# Patient Record
Sex: Female | Born: 1960 | Race: White | Hispanic: No | Marital: Married | Smoking: Never smoker
Health system: Southern US, Community
[De-identification: ages and names within clinical notes are randomized; demographics above are authoritative.]

## PROBLEM LIST (undated history)

## (undated) DIAGNOSIS — F32A Depression, unspecified: Secondary | ICD-10-CM

## (undated) DIAGNOSIS — F319 Bipolar disorder, unspecified: Secondary | ICD-10-CM

## (undated) DIAGNOSIS — S42009A Fracture of unspecified part of unspecified clavicle, initial encounter for closed fracture: Secondary | ICD-10-CM

## (undated) DIAGNOSIS — F329 Major depressive disorder, single episode, unspecified: Secondary | ICD-10-CM

## (undated) DIAGNOSIS — F209 Schizophrenia, unspecified: Secondary | ICD-10-CM

## (undated) HISTORY — PX: CLAVICLE SURGERY: SHX598

## (undated) HISTORY — PX: APPENDECTOMY: SHX54

## (undated) HISTORY — PX: BREAST ENHANCEMENT SURGERY: SHX7

## (undated) HISTORY — PX: CHOLECYSTECTOMY: SHX55

## (undated) HISTORY — PX: BREAST SURGERY: SHX581

---

## 2010-01-12 ENCOUNTER — Ambulatory Visit: Payer: Self-pay | Admitting: Internal Medicine

## 2010-09-27 ENCOUNTER — Ambulatory Visit: Payer: Self-pay | Admitting: Internal Medicine

## 2011-01-31 ENCOUNTER — Emergency Department: Payer: Self-pay | Admitting: Emergency Medicine

## 2011-07-23 ENCOUNTER — Emergency Department: Payer: Self-pay | Admitting: Emergency Medicine

## 2014-09-04 DIAGNOSIS — F333 Major depressive disorder, recurrent, severe with psychotic symptoms: Secondary | ICD-10-CM | POA: Diagnosis not present

## 2014-09-18 DIAGNOSIS — F333 Major depressive disorder, recurrent, severe with psychotic symptoms: Secondary | ICD-10-CM | POA: Diagnosis not present

## 2014-11-15 DIAGNOSIS — M5126 Other intervertebral disc displacement, lumbar region: Secondary | ICD-10-CM | POA: Diagnosis not present

## 2014-11-15 DIAGNOSIS — Y33XXXA Other specified events, undetermined intent, initial encounter: Secondary | ICD-10-CM | POA: Diagnosis not present

## 2014-11-15 DIAGNOSIS — S098XXA Other specified injuries of head, initial encounter: Secondary | ICD-10-CM | POA: Diagnosis not present

## 2014-11-15 DIAGNOSIS — M25512 Pain in left shoulder: Secondary | ICD-10-CM | POA: Diagnosis not present

## 2014-11-15 DIAGNOSIS — S299XXA Unspecified injury of thorax, initial encounter: Secondary | ICD-10-CM | POA: Diagnosis not present

## 2014-11-15 DIAGNOSIS — S2231XA Fracture of one rib, right side, initial encounter for closed fracture: Secondary | ICD-10-CM | POA: Diagnosis not present

## 2014-11-15 DIAGNOSIS — S01111A Laceration without foreign body of right eyelid and periocular area, initial encounter: Secondary | ICD-10-CM | POA: Diagnosis not present

## 2014-11-15 DIAGNOSIS — S0990XA Unspecified injury of head, initial encounter: Secondary | ICD-10-CM | POA: Diagnosis not present

## 2014-11-15 DIAGNOSIS — J939 Pneumothorax, unspecified: Secondary | ICD-10-CM | POA: Diagnosis not present

## 2014-11-15 DIAGNOSIS — M79602 Pain in left arm: Secondary | ICD-10-CM | POA: Diagnosis not present

## 2014-11-15 DIAGNOSIS — W19XXXA Unspecified fall, initial encounter: Secondary | ICD-10-CM | POA: Diagnosis not present

## 2014-11-15 DIAGNOSIS — F09 Unspecified mental disorder due to known physiological condition: Secondary | ICD-10-CM | POA: Diagnosis not present

## 2014-11-15 DIAGNOSIS — M5136 Other intervertebral disc degeneration, lumbar region: Secondary | ICD-10-CM | POA: Diagnosis not present

## 2014-11-15 DIAGNOSIS — S42022A Displaced fracture of shaft of left clavicle, initial encounter for closed fracture: Secondary | ICD-10-CM | POA: Diagnosis not present

## 2014-11-15 DIAGNOSIS — Z9882 Breast implant status: Secondary | ICD-10-CM | POA: Diagnosis not present

## 2014-11-15 DIAGNOSIS — S2242XA Multiple fractures of ribs, left side, initial encounter for closed fracture: Secondary | ICD-10-CM | POA: Diagnosis not present

## 2014-11-15 DIAGNOSIS — S42024A Nondisplaced fracture of shaft of right clavicle, initial encounter for closed fracture: Secondary | ICD-10-CM | POA: Diagnosis not present

## 2014-11-15 DIAGNOSIS — S2232XA Fracture of one rib, left side, initial encounter for closed fracture: Secondary | ICD-10-CM | POA: Diagnosis not present

## 2014-11-15 DIAGNOSIS — S270XXA Traumatic pneumothorax, initial encounter: Secondary | ICD-10-CM | POA: Diagnosis not present

## 2014-11-15 DIAGNOSIS — G8921 Chronic pain due to trauma: Secondary | ICD-10-CM | POA: Diagnosis not present

## 2014-11-15 DIAGNOSIS — G8911 Acute pain due to trauma: Secondary | ICD-10-CM | POA: Diagnosis not present

## 2014-11-15 DIAGNOSIS — M545 Low back pain: Secondary | ICD-10-CM | POA: Diagnosis not present

## 2014-11-15 DIAGNOSIS — S42002A Fracture of unspecified part of left clavicle, initial encounter for closed fracture: Secondary | ICD-10-CM | POA: Diagnosis not present

## 2014-11-15 DIAGNOSIS — Z043 Encounter for examination and observation following other accident: Secondary | ICD-10-CM | POA: Diagnosis not present

## 2014-11-26 DIAGNOSIS — S2232XA Fracture of one rib, left side, initial encounter for closed fracture: Secondary | ICD-10-CM | POA: Diagnosis not present

## 2014-11-26 DIAGNOSIS — S42022A Displaced fracture of shaft of left clavicle, initial encounter for closed fracture: Secondary | ICD-10-CM | POA: Diagnosis not present

## 2014-11-26 DIAGNOSIS — Y33XXXA Other specified events, undetermined intent, initial encounter: Secondary | ICD-10-CM | POA: Diagnosis not present

## 2014-11-26 DIAGNOSIS — M898X1 Other specified disorders of bone, shoulder: Secondary | ICD-10-CM | POA: Diagnosis not present

## 2014-11-26 DIAGNOSIS — S42022D Displaced fracture of shaft of left clavicle, subsequent encounter for fracture with routine healing: Secondary | ICD-10-CM | POA: Diagnosis not present

## 2014-11-26 DIAGNOSIS — S2249XA Multiple fractures of ribs, unspecified side, initial encounter for closed fracture: Secondary | ICD-10-CM | POA: Diagnosis not present

## 2014-11-28 DIAGNOSIS — S42022P Displaced fracture of shaft of left clavicle, subsequent encounter for fracture with malunion: Secondary | ICD-10-CM | POA: Diagnosis not present

## 2014-12-04 DIAGNOSIS — S42002D Fracture of unspecified part of left clavicle, subsequent encounter for fracture with routine healing: Secondary | ICD-10-CM | POA: Diagnosis not present

## 2014-12-11 DIAGNOSIS — S42002D Fracture of unspecified part of left clavicle, subsequent encounter for fracture with routine healing: Secondary | ICD-10-CM | POA: Diagnosis not present

## 2014-12-18 DIAGNOSIS — S42002A Fracture of unspecified part of left clavicle, initial encounter for closed fracture: Secondary | ICD-10-CM | POA: Diagnosis not present

## 2014-12-18 DIAGNOSIS — Z5321 Procedure and treatment not carried out due to patient leaving prior to being seen by health care provider: Secondary | ICD-10-CM | POA: Diagnosis not present

## 2014-12-18 DIAGNOSIS — S42002D Fracture of unspecified part of left clavicle, subsequent encounter for fracture with routine healing: Secondary | ICD-10-CM | POA: Diagnosis not present

## 2014-12-18 DIAGNOSIS — G8929 Other chronic pain: Secondary | ICD-10-CM | POA: Diagnosis not present

## 2014-12-18 DIAGNOSIS — M25512 Pain in left shoulder: Secondary | ICD-10-CM | POA: Diagnosis not present

## 2014-12-25 DIAGNOSIS — S42022D Displaced fracture of shaft of left clavicle, subsequent encounter for fracture with routine healing: Secondary | ICD-10-CM | POA: Diagnosis not present

## 2015-01-08 DIAGNOSIS — M25512 Pain in left shoulder: Secondary | ICD-10-CM | POA: Diagnosis not present

## 2015-01-08 DIAGNOSIS — S42002A Fracture of unspecified part of left clavicle, initial encounter for closed fracture: Secondary | ICD-10-CM | POA: Diagnosis not present

## 2015-01-08 DIAGNOSIS — G8929 Other chronic pain: Secondary | ICD-10-CM | POA: Diagnosis not present

## 2015-01-08 DIAGNOSIS — S42002D Fracture of unspecified part of left clavicle, subsequent encounter for fracture with routine healing: Secondary | ICD-10-CM | POA: Diagnosis not present

## 2015-01-08 DIAGNOSIS — Z5321 Procedure and treatment not carried out due to patient leaving prior to being seen by health care provider: Secondary | ICD-10-CM | POA: Diagnosis not present

## 2015-01-28 DIAGNOSIS — G8929 Other chronic pain: Secondary | ICD-10-CM | POA: Diagnosis not present

## 2015-01-28 DIAGNOSIS — S42002D Fracture of unspecified part of left clavicle, subsequent encounter for fracture with routine healing: Secondary | ICD-10-CM | POA: Diagnosis not present

## 2015-01-30 DIAGNOSIS — M25519 Pain in unspecified shoulder: Secondary | ICD-10-CM | POA: Diagnosis not present

## 2015-02-25 DIAGNOSIS — Z8781 Personal history of (healed) traumatic fracture: Secondary | ICD-10-CM | POA: Diagnosis not present

## 2015-02-25 DIAGNOSIS — T8484XA Pain due to internal orthopedic prosthetic devices, implants and grafts, initial encounter: Secondary | ICD-10-CM | POA: Diagnosis not present

## 2015-02-25 DIAGNOSIS — M25512 Pain in left shoulder: Secondary | ICD-10-CM | POA: Diagnosis not present

## 2015-02-27 DIAGNOSIS — G8929 Other chronic pain: Secondary | ICD-10-CM | POA: Diagnosis not present

## 2015-02-27 DIAGNOSIS — Z4789 Encounter for other orthopedic aftercare: Secondary | ICD-10-CM | POA: Diagnosis not present

## 2015-02-27 DIAGNOSIS — S42002A Fracture of unspecified part of left clavicle, initial encounter for closed fracture: Secondary | ICD-10-CM | POA: Diagnosis not present

## 2015-02-27 DIAGNOSIS — M25512 Pain in left shoulder: Secondary | ICD-10-CM | POA: Diagnosis not present

## 2015-02-27 DIAGNOSIS — S42002D Fracture of unspecified part of left clavicle, subsequent encounter for fracture with routine healing: Secondary | ICD-10-CM | POA: Diagnosis not present

## 2015-03-08 DIAGNOSIS — G8929 Other chronic pain: Secondary | ICD-10-CM | POA: Diagnosis not present

## 2015-03-08 DIAGNOSIS — S42002D Fracture of unspecified part of left clavicle, subsequent encounter for fracture with routine healing: Secondary | ICD-10-CM | POA: Diagnosis not present

## 2015-03-08 DIAGNOSIS — S42002A Fracture of unspecified part of left clavicle, initial encounter for closed fracture: Secondary | ICD-10-CM | POA: Diagnosis not present

## 2015-03-08 DIAGNOSIS — Z4789 Encounter for other orthopedic aftercare: Secondary | ICD-10-CM | POA: Diagnosis not present

## 2015-03-08 DIAGNOSIS — M25512 Pain in left shoulder: Secondary | ICD-10-CM | POA: Diagnosis not present

## 2015-03-12 DIAGNOSIS — Z4789 Encounter for other orthopedic aftercare: Secondary | ICD-10-CM | POA: Diagnosis not present

## 2015-03-12 DIAGNOSIS — G8929 Other chronic pain: Secondary | ICD-10-CM | POA: Diagnosis not present

## 2015-03-12 DIAGNOSIS — S42002D Fracture of unspecified part of left clavicle, subsequent encounter for fracture with routine healing: Secondary | ICD-10-CM | POA: Diagnosis not present

## 2015-03-12 DIAGNOSIS — M25512 Pain in left shoulder: Secondary | ICD-10-CM | POA: Diagnosis not present

## 2015-03-12 DIAGNOSIS — S42002A Fracture of unspecified part of left clavicle, initial encounter for closed fracture: Secondary | ICD-10-CM | POA: Diagnosis not present

## 2015-03-19 DIAGNOSIS — S42002A Fracture of unspecified part of left clavicle, initial encounter for closed fracture: Secondary | ICD-10-CM | POA: Diagnosis not present

## 2015-03-19 DIAGNOSIS — Z4789 Encounter for other orthopedic aftercare: Secondary | ICD-10-CM | POA: Diagnosis not present

## 2015-03-19 DIAGNOSIS — M25512 Pain in left shoulder: Secondary | ICD-10-CM | POA: Diagnosis not present

## 2015-03-19 DIAGNOSIS — G8929 Other chronic pain: Secondary | ICD-10-CM | POA: Diagnosis not present

## 2015-03-19 DIAGNOSIS — S42002D Fracture of unspecified part of left clavicle, subsequent encounter for fracture with routine healing: Secondary | ICD-10-CM | POA: Diagnosis not present

## 2015-03-26 DIAGNOSIS — M25512 Pain in left shoulder: Secondary | ICD-10-CM | POA: Diagnosis not present

## 2015-03-26 DIAGNOSIS — S42022D Displaced fracture of shaft of left clavicle, subsequent encounter for fracture with routine healing: Secondary | ICD-10-CM | POA: Diagnosis not present

## 2015-03-26 DIAGNOSIS — G8929 Other chronic pain: Secondary | ICD-10-CM | POA: Diagnosis not present

## 2015-03-26 DIAGNOSIS — S42002A Fracture of unspecified part of left clavicle, initial encounter for closed fracture: Secondary | ICD-10-CM | POA: Diagnosis not present

## 2015-03-26 DIAGNOSIS — Y33XXXD Other specified events, undetermined intent, subsequent encounter: Secondary | ICD-10-CM | POA: Diagnosis not present

## 2015-03-26 DIAGNOSIS — S42002D Fracture of unspecified part of left clavicle, subsequent encounter for fracture with routine healing: Secondary | ICD-10-CM | POA: Diagnosis not present

## 2015-03-26 DIAGNOSIS — Z4789 Encounter for other orthopedic aftercare: Secondary | ICD-10-CM | POA: Diagnosis not present

## 2015-04-30 DIAGNOSIS — F2089 Other schizophrenia: Secondary | ICD-10-CM | POA: Diagnosis not present

## 2015-04-30 DIAGNOSIS — F39 Unspecified mood [affective] disorder: Secondary | ICD-10-CM | POA: Diagnosis not present

## 2015-04-30 DIAGNOSIS — F333 Major depressive disorder, recurrent, severe with psychotic symptoms: Secondary | ICD-10-CM | POA: Diagnosis not present

## 2015-05-07 DIAGNOSIS — S42002D Fracture of unspecified part of left clavicle, subsequent encounter for fracture with routine healing: Secondary | ICD-10-CM | POA: Diagnosis not present

## 2015-05-07 DIAGNOSIS — M25512 Pain in left shoulder: Secondary | ICD-10-CM | POA: Diagnosis not present

## 2015-05-07 DIAGNOSIS — Y33XXXD Other specified events, undetermined intent, subsequent encounter: Secondary | ICD-10-CM | POA: Diagnosis not present

## 2015-05-27 DIAGNOSIS — N61 Mastitis without abscess: Secondary | ICD-10-CM | POA: Diagnosis not present

## 2015-05-27 DIAGNOSIS — Z1231 Encounter for screening mammogram for malignant neoplasm of breast: Secondary | ICD-10-CM | POA: Diagnosis not present

## 2015-05-28 ENCOUNTER — Other Ambulatory Visit: Payer: Self-pay | Admitting: Family Medicine

## 2015-05-28 DIAGNOSIS — Z1231 Encounter for screening mammogram for malignant neoplasm of breast: Secondary | ICD-10-CM

## 2015-06-04 ENCOUNTER — Ambulatory Visit: Payer: Self-pay

## 2015-06-18 DIAGNOSIS — R922 Inconclusive mammogram: Secondary | ICD-10-CM | POA: Diagnosis not present

## 2015-06-18 DIAGNOSIS — Z9882 Breast implant status: Secondary | ICD-10-CM | POA: Diagnosis not present

## 2015-06-18 DIAGNOSIS — N61 Mastitis without abscess: Secondary | ICD-10-CM | POA: Diagnosis not present

## 2015-06-18 DIAGNOSIS — N63 Unspecified lump in breast: Secondary | ICD-10-CM | POA: Diagnosis not present

## 2015-06-25 DIAGNOSIS — Y33XXXD Other specified events, undetermined intent, subsequent encounter: Secondary | ICD-10-CM | POA: Diagnosis not present

## 2015-06-25 DIAGNOSIS — S42002D Fracture of unspecified part of left clavicle, subsequent encounter for fracture with routine healing: Secondary | ICD-10-CM | POA: Diagnosis not present

## 2015-06-25 DIAGNOSIS — Z8781 Personal history of (healed) traumatic fracture: Secondary | ICD-10-CM | POA: Diagnosis not present

## 2015-06-25 DIAGNOSIS — Z967 Presence of other bone and tendon implants: Secondary | ICD-10-CM | POA: Diagnosis not present

## 2015-06-28 DIAGNOSIS — N63 Unspecified lump in breast: Secondary | ICD-10-CM | POA: Diagnosis not present

## 2015-07-08 DIAGNOSIS — R599 Enlarged lymph nodes, unspecified: Secondary | ICD-10-CM | POA: Diagnosis not present

## 2015-07-08 DIAGNOSIS — N63 Unspecified lump in breast: Secondary | ICD-10-CM | POA: Diagnosis not present

## 2015-07-08 DIAGNOSIS — N644 Mastodynia: Secondary | ICD-10-CM | POA: Diagnosis not present

## 2015-07-08 DIAGNOSIS — I10 Essential (primary) hypertension: Secondary | ICD-10-CM | POA: Diagnosis not present

## 2015-07-08 DIAGNOSIS — N6009 Solitary cyst of unspecified breast: Secondary | ICD-10-CM | POA: Diagnosis not present

## 2015-07-08 DIAGNOSIS — R928 Other abnormal and inconclusive findings on diagnostic imaging of breast: Secondary | ICD-10-CM | POA: Diagnosis not present

## 2015-07-08 DIAGNOSIS — N6001 Solitary cyst of right breast: Secondary | ICD-10-CM | POA: Diagnosis not present

## 2015-07-23 DIAGNOSIS — S42002K Fracture of unspecified part of left clavicle, subsequent encounter for fracture with nonunion: Secondary | ICD-10-CM | POA: Diagnosis not present

## 2015-07-23 DIAGNOSIS — F329 Major depressive disorder, single episode, unspecified: Secondary | ICD-10-CM | POA: Diagnosis not present

## 2015-07-23 DIAGNOSIS — Z9102 Food additives allergy status: Secondary | ICD-10-CM | POA: Diagnosis not present

## 2015-07-23 DIAGNOSIS — Z885 Allergy status to narcotic agent status: Secondary | ICD-10-CM | POA: Diagnosis not present

## 2015-07-23 DIAGNOSIS — S42024A Nondisplaced fracture of shaft of right clavicle, initial encounter for closed fracture: Secondary | ICD-10-CM | POA: Diagnosis not present

## 2015-07-23 DIAGNOSIS — Z9049 Acquired absence of other specified parts of digestive tract: Secondary | ICD-10-CM | POA: Diagnosis not present

## 2015-08-06 DIAGNOSIS — Z4789 Encounter for other orthopedic aftercare: Secondary | ICD-10-CM | POA: Diagnosis not present

## 2015-08-06 DIAGNOSIS — S42002K Fracture of unspecified part of left clavicle, subsequent encounter for fracture with nonunion: Secondary | ICD-10-CM | POA: Diagnosis not present

## 2015-08-16 DIAGNOSIS — S42002K Fracture of unspecified part of left clavicle, subsequent encounter for fracture with nonunion: Secondary | ICD-10-CM | POA: Diagnosis not present

## 2015-08-16 DIAGNOSIS — Z4789 Encounter for other orthopedic aftercare: Secondary | ICD-10-CM | POA: Diagnosis not present

## 2015-08-20 DIAGNOSIS — S42022D Displaced fracture of shaft of left clavicle, subsequent encounter for fracture with routine healing: Secondary | ICD-10-CM | POA: Diagnosis not present

## 2015-08-20 DIAGNOSIS — M25512 Pain in left shoulder: Secondary | ICD-10-CM | POA: Diagnosis not present

## 2015-09-17 DIAGNOSIS — S42002A Fracture of unspecified part of left clavicle, initial encounter for closed fracture: Secondary | ICD-10-CM | POA: Diagnosis not present

## 2015-09-17 DIAGNOSIS — Y33XXXA Other specified events, undetermined intent, initial encounter: Secondary | ICD-10-CM | POA: Diagnosis not present

## 2015-10-15 DIAGNOSIS — S42002D Fracture of unspecified part of left clavicle, subsequent encounter for fracture with routine healing: Secondary | ICD-10-CM | POA: Diagnosis not present

## 2015-10-15 DIAGNOSIS — Y33XXXD Other specified events, undetermined intent, subsequent encounter: Secondary | ICD-10-CM | POA: Diagnosis not present

## 2015-11-12 DIAGNOSIS — F333 Major depressive disorder, recurrent, severe with psychotic symptoms: Secondary | ICD-10-CM | POA: Diagnosis not present

## 2016-01-17 DIAGNOSIS — S0990XA Unspecified injury of head, initial encounter: Secondary | ICD-10-CM | POA: Diagnosis not present

## 2016-01-17 DIAGNOSIS — R0989 Other specified symptoms and signs involving the circulatory and respiratory systems: Secondary | ICD-10-CM | POA: Diagnosis not present

## 2016-01-17 DIAGNOSIS — R112 Nausea with vomiting, unspecified: Secondary | ICD-10-CM | POA: Diagnosis not present

## 2016-01-17 DIAGNOSIS — R0682 Tachypnea, not elsewhere classified: Secondary | ICD-10-CM | POA: Diagnosis not present

## 2016-01-17 DIAGNOSIS — J9690 Respiratory failure, unspecified, unspecified whether with hypoxia or hypercapnia: Secondary | ICD-10-CM | POA: Diagnosis not present

## 2016-01-17 DIAGNOSIS — R062 Wheezing: Secondary | ICD-10-CM | POA: Diagnosis not present

## 2016-01-17 DIAGNOSIS — S0031XA Abrasion of nose, initial encounter: Secondary | ICD-10-CM | POA: Diagnosis not present

## 2016-01-17 DIAGNOSIS — R079 Chest pain, unspecified: Secondary | ICD-10-CM | POA: Diagnosis not present

## 2016-01-17 DIAGNOSIS — S199XXA Unspecified injury of neck, initial encounter: Secondary | ICD-10-CM | POA: Diagnosis not present

## 2016-01-17 DIAGNOSIS — E872 Acidosis: Secondary | ICD-10-CM | POA: Diagnosis not present

## 2016-01-17 DIAGNOSIS — R51 Headache: Secondary | ICD-10-CM | POA: Diagnosis not present

## 2016-01-18 DIAGNOSIS — J690 Pneumonitis due to inhalation of food and vomit: Secondary | ICD-10-CM | POA: Diagnosis present

## 2016-01-18 DIAGNOSIS — E872 Acidosis: Secondary | ICD-10-CM | POA: Diagnosis present

## 2016-01-18 DIAGNOSIS — F329 Major depressive disorder, single episode, unspecified: Secondary | ICD-10-CM | POA: Diagnosis present

## 2016-01-18 DIAGNOSIS — R51 Headache: Secondary | ICD-10-CM | POA: Diagnosis present

## 2016-01-18 DIAGNOSIS — D72829 Elevated white blood cell count, unspecified: Secondary | ICD-10-CM | POA: Diagnosis not present

## 2016-01-18 DIAGNOSIS — J9601 Acute respiratory failure with hypoxia: Secondary | ICD-10-CM | POA: Diagnosis present

## 2016-01-18 DIAGNOSIS — R918 Other nonspecific abnormal finding of lung field: Secondary | ICD-10-CM | POA: Diagnosis not present

## 2016-01-18 DIAGNOSIS — J810 Acute pulmonary edema: Secondary | ICD-10-CM | POA: Diagnosis not present

## 2016-01-18 DIAGNOSIS — R000 Tachycardia, unspecified: Secondary | ICD-10-CM | POA: Diagnosis not present

## 2016-01-18 DIAGNOSIS — H538 Other visual disturbances: Secondary | ICD-10-CM | POA: Diagnosis present

## 2016-01-18 DIAGNOSIS — R042 Hemoptysis: Secondary | ICD-10-CM | POA: Diagnosis not present

## 2016-01-18 DIAGNOSIS — T751XXA Unspecified effects of drowning and nonfatal submersion, initial encounter: Secondary | ICD-10-CM | POA: Diagnosis present

## 2016-01-18 DIAGNOSIS — Z885 Allergy status to narcotic agent status: Secondary | ICD-10-CM | POA: Diagnosis not present

## 2016-01-24 DIAGNOSIS — T751XXS Unspecified effects of drowning and nonfatal submersion, sequela: Secondary | ICD-10-CM | POA: Diagnosis not present

## 2016-01-24 DIAGNOSIS — F3342 Major depressive disorder, recurrent, in full remission: Secondary | ICD-10-CM | POA: Diagnosis not present

## 2016-01-24 DIAGNOSIS — N951 Menopausal and female climacteric states: Secondary | ICD-10-CM | POA: Diagnosis not present

## 2016-03-23 ENCOUNTER — Ambulatory Visit
Admission: EM | Admit: 2016-03-23 | Discharge: 2016-03-23 | Disposition: A | Discharge status: Home / Self Care | Payer: Medicare Other | Attending: Family Medicine | Admitting: Family Medicine

## 2016-03-23 ENCOUNTER — Encounter: Payer: Self-pay | Admitting: *Deleted

## 2016-03-23 DIAGNOSIS — T63481A Toxic effect of venom of other arthropod, accidental (unintentional), initial encounter: Secondary | ICD-10-CM | POA: Diagnosis not present

## 2016-03-23 DIAGNOSIS — L089 Local infection of the skin and subcutaneous tissue, unspecified: Secondary | ICD-10-CM

## 2016-03-23 HISTORY — DX: Major depressive disorder, single episode, unspecified: F32.9

## 2016-03-23 HISTORY — DX: Depression, unspecified: F32.A

## 2016-03-23 MED ORDER — PREDNISONE 20 MG PO TABS: 40.0000 mg | ORAL_TABLET | Freq: Every day | ORAL | 0 refills | Status: DC

## 2016-03-23 MED ORDER — MUPIROCIN 2 % EX OINT: TOPICAL_OINTMENT | CUTANEOUS | 0 refills | Status: DC

## 2016-03-23 MED ORDER — SULFAMETHOXAZOLE-TRIMETHOPRIM 800-160 MG PO TABS: 1.0000 | ORAL_TABLET | Freq: Two times a day (BID) | ORAL | 0 refills | Status: AC

## 2016-03-23 NOTE — ED Provider Notes (Signed)
[facility] ____________________________________________  Time seen: Approximately 11:22 AM  I have reviewed the triage vital signs and the nursing notes.   HISTORY  Chief Complaint Insect Bite   HPI Frances Stanley is a 55 y.o. female presents for complaints of redness to bilateral forearms. Patient reports 3 days ago she was working outside and was stung by wasps multiples times to her upper arms and one to face. States stung twice to right arm and three times to left. States she immediately had some swelling in these areas, but reports has since had redness onset and continued swelling. States concern for infection.   Reports the areas are itchy, but minimally tender. Denies current pain. Reports feels well otherwise. Reports unresolved with over the counter benadryl.   Denies chest pain, shortness of breath, facial or lip or oral swelling. Denies numbness, tingling sensation. Denies decreased range of motion. Reports continues to eat and drink well. Denies recent sickness. Denies other insect bite or stings, denies tick bite or tick attachments.  No LMP recorded. Patient is postmenopausal.  Reports tetanus immunization is up to date.    Past Medical History:  Diagnosis Date  . Depression     There are no active problems to display for this patient.   History reviewed. No pertinent surgical history.   No current facility-administered medications for this encounter.   Current Outpatient Prescriptions:  .  venlafaxine (EFFEXOR) 75 MG tablet, Take 75 mg by mouth 2 (two) times daily., Disp: , Rfl:  .  mupirocin ointment (BACTROBAN) 2 %, Apply three times a day for 5 days., Disp: 22 g, Rfl: 0 .  predniSONE (DELTASONE) 20 MG tablet, Take 2 tablets (40 mg total) by mouth daily., Disp: 10 tablet, Rfl: 0 .  sulfamethoxazole-trimethoprim (BACTRIM DS,SEPTRA DS) 800-160 MG tablet, Take 1 tablet by mouth 2 (two) times daily., Disp: 20 tablet, Rfl: 0  Allergies Morphine  and related  History reviewed. No pertinent family history.  Social History Social History  Substance Use Topics  . Smoking status: Never Smoker  . Smokeless tobacco: Never Used  . Alcohol use Yes    Review of Systems Constitutional: No fever/chills Eyes: No visual changes. ENT: No sore throat. Cardiovascular: Denies chest pain. Respiratory: Denies shortness of breath. Gastrointestinal: No abdominal pain.  No nausea, no vomiting.  No diarrhea.  No constipation. Genitourinary: Negative for dysuria. Musculoskeletal: Negative for back pain. Skin: positive for rash. Neurological: Negative for headaches, focal weakness or numbness.  10-point ROS otherwise negative.  ____________________________________________   PHYSICAL EXAM:  VITAL SIGNS: ED Triage Vitals [03/23/16 1053]  Enc Vitals Group     BP (!) 152/93     Pulse Rate 81     Resp 16     Temp 97.9 F (36.6 C)     Temp Source Oral     SpO2 100 %     Weight 184 lb (83.5 kg)     Height  (1.727 m)     Head Circumference      Peak Flow      Pain Score      Pain Loc      Pain Edu?      Excl. in GC?     Constitutional: Alert and oriented. Well appearing and in no acute distress. Eyes: Conjunctivae are normal. PERRL. EOMI. ENT      Head: Normocephalic and atraumatic.      Nose: No congestion/rhinnorhea.      Mouth/Throat: Mucous membranes are moist.Oropharynx non-erythematous.  No tonsillar swelling. No lip, tongue or oral swelling.  Hematological/Lymphatic/Immunilogical: No cervical lymphadenopathy. Cardiovascular: Normal rate, regular rhythm. Grossly normal heart sounds.  Good peripheral circulation. Respiratory: Normal respiratory effort without tachypnea nor retractions. Breath sounds are clear and equal bilaterally. No wheezes/rales/rhonchi.. Gastrointestinal: Soft and nontender. No distention.  Musculoskeletal:  Nontender with normal range of motion in all extremities. No midline cervical, thoracic or  lumbar tenderness to palpation.       Right lower leg:  No tenderness or edema.      Left lower leg:  No tenderness or edema.  Neurologic:  Normal speech and language. No gross focal neurologic deficits are appreciated. Speech is normal. No gait instability.  Skin:  Skin is warm, dry and intact. No rash noted. Except: left upper medial arm x two areas of approximately 10cm x7 cm of erythema and minimal swelling with centered punctum, and right upper arm small area of mild erythema with centered punctum, swelling and erythema are non-circumferential, no induration, no fluctuance, nontender, no drainage, no foreign body visualized, full range of motion to bilateral upper and lower extremities with normal sensation, no motor or tendon deficits to bilateral upper extremities, bilateral hand grips strong and equal, bilateral distal radial pulses equal and easily palpated.  Psychiatric: Mood and affect are normal. Speech and behavior are normal. Patient exhibits appropriate insight and judgment   ___________________________________________   LABS (all labs ordered are listed, but only abnormal results are displayed)  Labs Reviewed - No data to display  PROCEDURES Procedures   INITIAL IMPRESSION / ASSESSMENT AND PLAN / ED COURSE  Pertinent labs & imaging results that were available during my care of the patient were reviewed by me and considered in my medical decision making (see chart for details).  Very well-appearing patient. No acute distress. Presents for the complaints of redness with mild swelling directly surrounding recent wasp stings. Patient reports she had some immediate swelling but states concerned as the redness has gradually came on and has continued to spread. Neurovascular intact. Patient denies pain or other complaints. Suspect local reaction to insect sting and concern for secondary cellulitis. Will treat patient with oral prednisone and oral Bactrim, will also treat with topical  Bactroban. Encourage closely monitoring and avoidance of scratching. Encourage PCP follow up as needed.  Discussed follow up with Primary care physician this week. Discussed follow up and return parameters including no resolution or any worsening concerns. Patient verbalized understanding and agreed to plan.   ____________________________________________   FINAL CLINICAL IMPRESSION(S) / ED DIAGNOSES  Final diagnoses:  Local reaction to insect sting, accidental or unintentional, initial encounter  Skin infection     Discharge Medication List as of 03/23/2016 11:26 AM    START taking these medications   Details  mupirocin ointment (BACTROBAN) 2 % Apply three times a day for 5 days., Normal    predniSONE (DELTASONE) 20 MG tablet Take 2 tablets (40 mg total) by mouth daily., Starting Mon 03/23/2016, Normal    sulfamethoxazole-trimethoprim (BACTRIM DS,SEPTRA DS) 800-160 MG tablet Take 1 tablet by mouth 2 (two) times daily., Starting Mon 03/23/2016, Until Mon 03/30/2016, Normal        Note: This dictation was prepared with Dragon dictation along with smaller phrase technology. Any transcriptional errors that result from this process are unintentional.    Clinical Course      Renford DillsLindsey Brazil Voytko, NP 03/27/16 1456

## 2016-03-23 NOTE — ED Triage Notes (Signed)
Pt was stung several times by wasps to both arms and face. Occurred Friday.  Arms red and edematous.

## 2016-03-23 NOTE — Discharge Instructions (Signed)
Take medication as prescribed. Rest. Drink plenty of fluids. Elevate, avoid scratching.   Follow up with your primary care physician this week as needed. Return to Urgent care for new or worsening concerns.

## 2016-05-12 DIAGNOSIS — F333 Major depressive disorder, recurrent, severe with psychotic symptoms: Secondary | ICD-10-CM | POA: Diagnosis not present

## 2016-06-02 DIAGNOSIS — F333 Major depressive disorder, recurrent, severe with psychotic symptoms: Secondary | ICD-10-CM | POA: Diagnosis not present

## 2016-06-26 DIAGNOSIS — H43811 Vitreous degeneration, right eye: Secondary | ICD-10-CM | POA: Diagnosis not present

## 2016-06-26 DIAGNOSIS — H43392 Other vitreous opacities, left eye: Secondary | ICD-10-CM | POA: Diagnosis not present

## 2016-07-20 DIAGNOSIS — R399 Unspecified symptoms and signs involving the genitourinary system: Secondary | ICD-10-CM | POA: Diagnosis not present

## 2016-08-02 ENCOUNTER — Encounter: Payer: Self-pay | Admitting: Gynecology

## 2016-08-02 ENCOUNTER — Ambulatory Visit
Admission: EM | Admit: 2016-08-02 | Discharge: 2016-08-02 | Disposition: A | Discharge status: Home / Self Care | Payer: Medicare Other | Attending: Family Medicine | Admitting: Family Medicine

## 2016-08-02 DIAGNOSIS — B029 Zoster without complications: Secondary | ICD-10-CM | POA: Diagnosis not present

## 2016-08-02 HISTORY — DX: Fracture of unspecified part of unspecified clavicle, initial encounter for closed fracture: S42.009A

## 2016-08-02 MED ORDER — VALACYCLOVIR HCL 1 G PO TABS: 1000.0000 mg | ORAL_TABLET | Freq: Three times a day (TID) | ORAL | 0 refills | Status: DC

## 2016-08-02 MED ORDER — GABAPENTIN 300 MG PO CAPS: ORAL_CAPSULE | ORAL | 0 refills | Status: DC

## 2016-08-02 NOTE — ED Triage Notes (Signed)
Per patient burning / painful rash at back and underneath her breast xyesterday

## 2016-08-02 NOTE — Discharge Instructions (Signed)
Take the medications as prescribed.  You can continue to use tylenol and motrin as well.  Take care  Dr. Adriana Simasook

## 2016-08-02 NOTE — ED Provider Notes (Signed)
[facility]  CSN: [id number] Arrival date & time: 08/02/16  [id number]   History   Chief Complaint Chief Complaint  Patient presents with  . Herpes Zoster   HPI  55 year old female presents with concerns for shingles.  Patient states that she developed a sensation of pins and needles and subsequently developed a rash on her upper back extending anteriorly to the right breast. She reports that it has been extremely painful. She's had associated body aches and generalized malaise. No fevers or chills. She been taking Motrin and Tylenol with mild relief. No other associated symptoms. No other complaints or concerns at this time.  Past Medical History:  Diagnosis Date  . Clavicle fracture   . Depression    Past Surgical History:  Procedure Laterality Date  . CLAVICLE SURGERY     OB History    No data available     Home Medications    Prior to Admission medications   Medication Sig Start Date End Date Taking? Authorizing Provider  venlafaxine (EFFEXOR) 75 MG tablet Take 75 mg by mouth 2 (two) times daily.   Yes Historical Provider, MD  gabapentin (NEURONTIN) 300 MG capsule 300 mg on day 1, 300 mg twice daily on day 2, then 300 mg three times daily for postherpetic neuralgia. Can be increased to 600 mg three times daily if needed. 08/02/16   Tommie SamsJayce G Browning [place], DO  mupirocin ointment (BACTROBAN) 2 % Apply three times a day for 5 days. 03/23/16   Renford DillsLindsey Miller, NP  predniSONE (DELTASONE) 20 MG tablet Take 2 tablets (40 mg total) by mouth daily. 03/23/16   Renford DillsLindsey Miller, NP  valACYclovir (VALTREX) 1000 MG tablet Take 1 tablet (1,000 mg total) by mouth 3 (three) times daily. 08/02/16   Tommie SamsJayce G Mackenzey Crownover, DO   Family History No pertinent family history for this issue.  Social History Social History  Substance Use Topics  . Smoking status: Never Smoker  . Smokeless tobacco: Never Used  . Alcohol use Yes   Allergies   Morphine and related  Review of Systems Review of Systems    Constitutional: Positive for fatigue.  Gastrointestinal: Positive for nausea.  Skin: Positive for rash.   Physical Exam Triage Vital Signs ED Triage Vitals  Enc Vitals Group     BP 08/02/16 0919 (!) 147/77     Pulse Rate 08/02/16 0919 72     Resp --      Temp 08/02/16 0919 98.1 F (36.7 C)     Temp Source 08/02/16 0919 Oral     SpO2 08/02/16 0919 99 %     Weight 08/02/16 0921 187 lb (84.8 kg)     Height --      Head Circumference --      Peak Flow --      Pain Score --      Pain Loc --      Pain Edu? --      Excl. in GC? --    Updated Vital Signs BP (!) 147/77 (BP Location: Left Arm)   Pulse 72   Temp 98.1 F (36.7 C) (Oral)   Wt 187 lb (84.8 kg)   SpO2 99%   BMI 28.43 kg/m   Physical Exam  Constitutional: She is oriented to person, place, and time. She appears well-developed. No distress.  HENT:  Head: Normocephalic and atraumatic.  Pulmonary/Chest: Effort normal.  Neurological: She is alert and oriented to person, place, and time.  Skin:  Scattered vesicular rash noted on  the right upper back.   Psychiatric: She has a normal mood and affect.  Vitals reviewed.  UC Treatments / Results  Labs (all labs ordered are listed, but only abnormal results are displayed) Labs Reviewed - No data to display  EKG  EKG Interpretation None       Radiology No results found.  Procedures Procedures (including critical care time)  Medications Ordered in UC Medications - No data to display   Initial Impression / Assessment and Plan / UC Course  I have reviewed the triage vital signs and the nursing notes.  Pertinent labs & imaging results that were available during my care of the patient were reviewed by me and considered in my medical decision making (see chart for details).  Clinical Course   Patient presenting with concern for shingles. History and exam consistent with herpes zoster. Treating with Valtrex and gabapentin.  Final Clinical Impressions(s) / UC  Diagnoses   Final diagnoses:  Herpes zoster without complication    New Prescriptions New Prescriptions   GABAPENTIN (NEURONTIN) 300 MG CAPSULE    300 mg on day 1, 300 mg twice daily on day 2, then 300 mg three times daily for postherpetic neuralgia. Can be increased to 600 mg three times daily if needed.   VALACYCLOVIR (VALTREX) 1000 MG TABLET    Take 1 tablet (1,000 mg total) by mouth 3 (three) times daily.     Tommie SamsJayce G Kennley Schwandt, DO 08/02/16 [phone]

## 2016-08-05 ENCOUNTER — Ambulatory Visit
Admission: EM | Admit: 2016-08-05 | Discharge: 2016-08-05 | Disposition: A | Discharge status: Home / Self Care | Payer: Medicare Other | Attending: Emergency Medicine | Admitting: Emergency Medicine

## 2016-08-05 ENCOUNTER — Encounter: Payer: Self-pay | Admitting: Emergency Medicine

## 2016-08-05 DIAGNOSIS — B029 Zoster without complications: Secondary | ICD-10-CM | POA: Diagnosis not present

## 2016-08-05 MED ORDER — ONDANSETRON 4 MG PO TBDP: 4.0000 mg | ORAL_TABLET | Freq: Three times a day (TID) | ORAL | 0 refills | Status: AC | PRN

## 2016-08-05 MED ORDER — PREDNISONE 20 MG PO TABS: 20.0000 mg | ORAL_TABLET | Freq: Every day | ORAL | 0 refills | Status: AC

## 2016-08-05 MED ORDER — HYDROCODONE-ACETAMINOPHEN 5-325 MG PO TABS: 2.0000 | ORAL_TABLET | Freq: Four times a day (QID) | ORAL | 0 refills | Status: DC | PRN

## 2016-08-05 MED ORDER — HYDROCODONE-ACETAMINOPHEN 5-325 MG PO TABS: 1.0000 | ORAL_TABLET | Freq: Four times a day (QID) | ORAL | 0 refills | Status: AC | PRN

## 2016-08-05 MED ORDER — ONDANSETRON 4 MG PO TBDP: 4.0000 mg | ORAL_TABLET | Freq: Three times a day (TID) | ORAL | 0 refills | Status: DC | PRN

## 2016-08-05 MED ORDER — ONDANSETRON 8 MG PO TBDP
8.0000 mg | ORAL_TABLET | Freq: Once | ORAL | Status: AC
  Administered 2016-08-05: 8 mg via ORAL

## 2016-08-05 NOTE — ED Provider Notes (Signed)
CSN: [id number]     Arrival date & time 08/05/16  1216 History   First MD Initiated Contact with Patient 08/05/16 1405     Chief Complaint  Patient presents with  . Herpes Zoster   (Consider location/radiation/quality/duration/timing/severity/associated sxs/prior Treatment) Patient is a 55 y.o. Female, Retired Designer, jewelleryregistered nurse, here for shingles. Patient was seem on 12/17 here for rash and was diagnosed with shingle. She reports the rash to be very painful with burning sensation. Rash is located at right upper back extending anteriorly to the right breast. She was given Gabapentin and Valacyclovir 1000 mg TID x 7 days. Today is her 3rd day on the antiviral. Patient reports the rash is somewhat improving but she is also noticing new rash breaking out along the same dermatone as well.  She reports feeling very uncomfortable and is in pain. Patient have been taking tylenol, ibuprofen and naprosyn for pain with no relief. Patient also reports feeling nauseous.         Past Medical History:  Diagnosis Date  . Clavicle fracture   . Depression    Past Surgical History:  Procedure Laterality Date  . CLAVICLE SURGERY     History reviewed. No pertinent family history. Social History  Substance Use Topics  . Smoking status: Never Smoker  . Smokeless tobacco: Never Used  . Alcohol use Yes   OB History    No data available     Review of Systems  Constitutional:       As stated in the HPI    Allergies  Morphine and related  Home Medications   Prior to Admission medications   Medication Sig Start Date End Date Taking? Authorizing Provider  gabapentin (NEURONTIN) 300 MG capsule 300 mg on day 1, 300 mg twice daily on day 2, then 300 mg three times daily for postherpetic neuralgia. Can be increased to 600 mg three times daily if needed. 08/02/16  Yes Tommie SamsJayce G Cook, DO  valACYclovir (VALTREX) 1000 MG tablet Take 1 tablet (1,000 mg total) by mouth 3 (three) times daily. 08/02/16  Yes  Tommie SamsJayce G Cook, DO  venlafaxine (EFFEXOR) 75 MG tablet Take 75 mg by mouth 2 (two) times daily.   Yes Historical Provider, MD  HYDROcodone-acetaminophen (NORCO/VICODIN) 5-325 MG tablet Take 2 tablets by mouth every 6 (six) hours as needed. 08/05/16 08/10/16  Lucia EstelleFeng Devesh Monforte, NP  mupirocin ointment (BACTROBAN) 2 % Apply three times a day for 5 days. 03/23/16   Renford DillsLindsey Miller, NP  predniSONE (DELTASONE) 20 MG tablet Take 1 tablet (20 mg total) by mouth daily with breakfast. 3 tablets daily on day 1-3, 2 tablets daily on day 4-6, 1 tab daily on day 7-9 08/05/16 08/14/16  Lucia EstelleFeng Kasey Ewings, NP   Meds Ordered and Administered this Visit   Medications  ondansetron (ZOFRAN-ODT) disintegrating tablet 8 mg (8 mg Oral Given 08/05/16 1416)    BP 140/71   Pulse 80   Temp 97.9 F (36.6 C) (Oral)   Resp 20   Ht 5\' 8"  (1.727 m)   Wt 187 lb (84.8 kg)   SpO2 100%   BMI 28.43 kg/m  No data found.   Physical Exam  Constitutional: She appears well-developed and well-nourished.  HENT:  Head: Normocephalic.  Cardiovascular: Normal rate.   Pulmonary/Chest: Effort normal.  Skin:  See picture below.   Rash follows dermatone. Some of the rash are clustered vesicles. Some rash are crusted over. Finding consistent with shingles  Nursing note and vitals reviewed.  Urgent Care Course   Clinical Course     Procedures (including critical care time)  Labs Review Labs Reviewed - No data to display  Imaging Review No results found.    MDM   1. Herpes zoster without complication    1) Continue to finish the valacyclovir 2) Will trial the oral prednisone 9-day taper to see if this will help.  3) Start Hydrocodone-APAP for pain relief PRN. [place] Controlled substances database reviewed and is appropriate 4) Return if no improvement is noted.      Lucia EstelleFeng Kiante Petrovich, NP 08/05/16 1431

## 2016-08-05 NOTE — ED Triage Notes (Signed)
Patient states she is continuing to break out on her chest even though she is using all the medications as directed

## 2016-09-21 DIAGNOSIS — N6489 Other specified disorders of breast: Secondary | ICD-10-CM | POA: Diagnosis not present

## 2016-09-21 DIAGNOSIS — N6459 Other signs and symptoms in breast: Secondary | ICD-10-CM | POA: Diagnosis not present

## 2016-09-21 DIAGNOSIS — T85898A Other specified complication of other internal prosthetic devices, implants and grafts, initial encounter: Secondary | ICD-10-CM | POA: Diagnosis not present

## 2017-04-23 DIAGNOSIS — R197 Diarrhea, unspecified: Secondary | ICD-10-CM | POA: Diagnosis not present

## 2017-04-23 DIAGNOSIS — Z8719 Personal history of other diseases of the digestive system: Secondary | ICD-10-CM | POA: Diagnosis not present

## 2017-04-23 DIAGNOSIS — R112 Nausea with vomiting, unspecified: Secondary | ICD-10-CM | POA: Diagnosis not present

## 2017-04-23 DIAGNOSIS — R5383 Other fatigue: Secondary | ICD-10-CM | POA: Diagnosis not present

## 2017-04-23 DIAGNOSIS — R1084 Generalized abdominal pain: Secondary | ICD-10-CM | POA: Diagnosis not present

## 2017-04-23 DIAGNOSIS — R05 Cough: Secondary | ICD-10-CM | POA: Diagnosis not present

## 2017-04-23 DIAGNOSIS — R6883 Chills (without fever): Secondary | ICD-10-CM | POA: Diagnosis not present

## 2017-04-26 DIAGNOSIS — R112 Nausea with vomiting, unspecified: Secondary | ICD-10-CM | POA: Diagnosis not present

## 2017-04-26 DIAGNOSIS — R062 Wheezing: Secondary | ICD-10-CM | POA: Diagnosis not present

## 2017-04-26 DIAGNOSIS — R197 Diarrhea, unspecified: Secondary | ICD-10-CM | POA: Diagnosis not present

## 2017-04-26 DIAGNOSIS — J159 Unspecified bacterial pneumonia: Secondary | ICD-10-CM | POA: Diagnosis not present

## 2017-05-25 DIAGNOSIS — R05 Cough: Secondary | ICD-10-CM | POA: Diagnosis not present

## 2017-05-27 DIAGNOSIS — R05 Cough: Secondary | ICD-10-CM | POA: Diagnosis not present

## 2017-06-15 DIAGNOSIS — J309 Allergic rhinitis, unspecified: Secondary | ICD-10-CM | POA: Diagnosis not present

## 2017-06-15 DIAGNOSIS — J4540 Moderate persistent asthma, uncomplicated: Secondary | ICD-10-CM | POA: Diagnosis not present

## 2017-06-15 DIAGNOSIS — E039 Hypothyroidism, unspecified: Secondary | ICD-10-CM | POA: Diagnosis not present

## 2017-06-15 DIAGNOSIS — J679 Hypersensitivity pneumonitis due to unspecified organic dust: Secondary | ICD-10-CM | POA: Diagnosis not present

## 2017-06-15 DIAGNOSIS — Z23 Encounter for immunization: Secondary | ICD-10-CM | POA: Diagnosis not present

## 2017-06-15 DIAGNOSIS — R05 Cough: Secondary | ICD-10-CM | POA: Diagnosis not present

## 2017-06-21 DIAGNOSIS — J4540 Moderate persistent asthma, uncomplicated: Secondary | ICD-10-CM | POA: Diagnosis not present

## 2017-06-21 DIAGNOSIS — J309 Allergic rhinitis, unspecified: Secondary | ICD-10-CM | POA: Diagnosis not present

## 2017-06-21 DIAGNOSIS — E039 Hypothyroidism, unspecified: Secondary | ICD-10-CM | POA: Diagnosis not present

## 2017-06-21 DIAGNOSIS — J679 Hypersensitivity pneumonitis due to unspecified organic dust: Secondary | ICD-10-CM | POA: Diagnosis not present

## 2017-09-12 ENCOUNTER — Emergency Department
Admission: EM | Admit: 2017-09-12 | Discharge: 2017-09-12 | Disposition: A | Discharge status: Home / Self Care | Payer: Medicare Other | Attending: Student in an Organized Health Care Education/Training Program | Admitting: Student in an Organized Health Care Education/Training Program

## 2017-09-12 ENCOUNTER — Encounter: Payer: Self-pay | Admitting: Emergency Medicine

## 2017-09-12 ENCOUNTER — Emergency Department: Payer: Medicare Other

## 2017-09-12 ENCOUNTER — Other Ambulatory Visit: Payer: Self-pay

## 2017-09-12 DIAGNOSIS — Z79899 Other long term (current) drug therapy: Secondary | ICD-10-CM | POA: Insufficient documentation

## 2017-09-12 DIAGNOSIS — S62212A Bennett's fracture, left hand, initial encounter for closed fracture: Secondary | ICD-10-CM

## 2017-09-12 DIAGNOSIS — W1789XA Other fall from one level to another, initial encounter: Secondary | ICD-10-CM | POA: Diagnosis not present

## 2017-09-12 DIAGNOSIS — S62232A Other displaced fracture of base of first metacarpal bone, left hand, initial encounter for closed fracture: Secondary | ICD-10-CM | POA: Diagnosis not present

## 2017-09-12 DIAGNOSIS — S6992XA Unspecified injury of left wrist, hand and finger(s), initial encounter: Secondary | ICD-10-CM | POA: Diagnosis present

## 2017-09-12 DIAGNOSIS — S62311A Displaced fracture of base of second metacarpal bone. left hand, initial encounter for closed fracture: Secondary | ICD-10-CM | POA: Diagnosis not present

## 2017-09-12 DIAGNOSIS — Y939 Activity, unspecified: Secondary | ICD-10-CM | POA: Insufficient documentation

## 2017-09-12 DIAGNOSIS — Y999 Unspecified external cause status: Secondary | ICD-10-CM | POA: Diagnosis not present

## 2017-09-12 DIAGNOSIS — Y929 Unspecified place or not applicable: Secondary | ICD-10-CM | POA: Insufficient documentation

## 2017-09-12 MED ORDER — ONDANSETRON 4 MG PO TBDP
4.0000 mg | ORAL_TABLET | Freq: Once | ORAL | Status: AC
  Administered 2017-09-12: 4 mg via ORAL
  Filled 2017-09-12: qty 1

## 2017-09-12 MED ORDER — ONDANSETRON 4 MG PO TBDP: 4.0000 mg | ORAL_TABLET | Freq: Three times a day (TID) | ORAL | 0 refills | Status: DC | PRN

## 2017-09-12 MED ORDER — HYDROCODONE-ACETAMINOPHEN 5-325 MG PO TABS: 1.0000 | ORAL_TABLET | Freq: Four times a day (QID) | ORAL | 0 refills | Status: DC | PRN

## 2017-09-12 NOTE — Discharge Instructions (Signed)
Call Dr. Rosita KeaMenz office for an appointment.  Tell them you are seen in the emergency department and we discussed this with him.  Keep the hand elevated and iced as much as possible.  Use the medication as prescribed.  If you cannot feel your fingers are the pain is becoming worse please return to the emergency department

## 2017-09-12 NOTE — ED Triage Notes (Signed)
Pt to ED via POV, pt states that she fell off of her horse yesterday. Pt states that she did hit her head but had a helmet on, denies LOC. Pt is not on blood thinner. Pt has pain and swelling in the left hand. Pt denies pain any where else. Pt in NAD at this time

## 2017-09-12 NOTE — ED Notes (Signed)
See triage note  Stats she fell from horse yesterday  Swelling and bruising noted to left hand

## 2017-09-12 NOTE — ED Provider Notes (Signed)
[facility] Emergency Department Provider Note  ____________________________________________   First MD Initiated Contact with Patient 09/12/17 1149     (approximate)  I have reviewed the triage vital signs and the nursing notes.   HISTORY  Chief Complaint Fall and Hand Pain    HPI Frances Stanley is a 57 y.o. female complains of left hand pain.  She is left-handed.  States she fell off her horse yesterday.  She did have a helmet on but did hit her head.  She denies loss of consciousness.  She states she has not had a headache since the fall.  She denies elbow or shoulder pain.  She denies neck pain.  She is otherwise healthy  Past Medical History:  Diagnosis Date  . Clavicle fracture   . Depression     There are no active problems to display for this patient.   Past Surgical History:  Procedure Laterality Date  . CLAVICLE SURGERY      Prior to Admission medications   Medication Sig Start Date End Date Taking? Authorizing Provider  HYDROcodone-acetaminophen (NORCO/VICODIN) 5-325 MG tablet Take 1 tablet by mouth every 6 (six) hours as needed for moderate pain. 09/12/17   Fisher, Roselyn Bering, PA-C  ondansetron (ZOFRAN-ODT) 4 MG disintegrating tablet Take 1 tablet (4 mg total) by mouth every 8 (eight) hours as needed for nausea or vomiting. 09/12/17   Sherrie Mustache Roselyn Bering, PA-C  venlafaxine (EFFEXOR) 75 MG tablet Take 75 mg by mouth 2 (two) times daily.    [provider]    Allergies Morphine and related  No family history on file.  Social History Social History   Tobacco Use  . Smoking status: Never Smoker  . Smokeless tobacco: Never Used  Substance Use Topics  . Alcohol use: Yes  . Drug use: Not on file    Review of Systems  Constitutional: No fever/chills Eyes: No visual changes. ENT: No sore throat. Respiratory: Denies cough Genitourinary: Negative for dysuria. Musculoskeletal: Negative for back pain.  Positive for left hand  pain Skin: Negative for rash.    ____________________________________________   PHYSICAL EXAM:  VITAL SIGNS: ED Triage Vitals  Enc Vitals Group     BP 09/12/17 1141 (!) 175/94     Pulse Rate 09/12/17 1141 74     Resp 09/12/17 1141 16     Temp 09/12/17 1141 98.1 F (36.7 C)     Temp Source 09/12/17 1141 Oral     SpO2 09/12/17 1141 97 %     Weight 09/12/17 1143 187 lb (84.8 kg)     Height --      Head Circumference --      Peak Flow --      Pain Score 09/12/17 1142 5     Pain Loc --      Pain Edu? --      Excl. in GC? --     Constitutional: Alert and oriented. Well appearing and in no acute distress.  Patient is able to answer all questions in an appropriate manner Eyes: Conjunctivae are normal.  Head: Atraumatic. Neck: Neck is supple, nontender Nose: No congestion/rhinnorhea. Mouth/Throat: Mucous membranes are moist.   Cardiovascular: Normal rate, regular rhythm.  Heart sounds are normal Respiratory: Normal respiratory effort.  No retractions, lungs are clear to auscultation GU: deferred Musculoskeletal: The left hand is grossly bruised and swollen on the dorsal and volar aspects.  She is able to move her fingers and cap refills less than 1 second.  There is bony tenderness across the entire hand.  The wrist is not tender, the elbow is not tender, and the shoulder is not tender.  The right hand appears to be normal, neurovascular is intact on both hands  neurologic:  Normal speech and language.  Skin:  Skin is warm, dry and intact. No rash noted.  Positive for a large amount of bruising on the left hand Psychiatric: Mood and affect are normal. Speech and behavior are normal.  ____________________________________________   LABS (all labs ordered are listed, but only abnormal results are displayed)  Labs Reviewed - No data to display ____________________________________________   ____________________________________________  RADIOLOGY  X-ray of the left hand  shows an oblique fracture across the base of the first metacarpal  ____________________________________________   PROCEDURES  Procedure(s) performed:   .Splint Application Date/Time: 09/12/2017 12:33 PM Performed by: Faythe Ghee, PA-C Authorized by: Faythe Ghee, PA-C   Consent:    Consent obtained:  Verbal   Consent given by:  Patient   Risks discussed:  Discoloration, numbness, pain and swelling   Alternatives discussed:  No treatment Pre-procedure details:    Sensation:  Normal Procedure details:    Laterality:  Left   Location:  Finger   Finger:  L thumb   Strapping: no     Splint type:  Thumb spica   Supplies:  Ortho-Glass Post-procedure details:    Pain:  Unchanged   Patient tolerance of procedure:  Tolerated well, no immediate complications Comments:     Patient was neurovascularly intact post splint application      ____________________________________________   INITIAL IMPRESSION / ASSESSMENT AND PLAN / ED COURSE  Pertinent labs & imaging results that were available during my care of the patient were reviewed by me and considered in my medical decision making (see chart for details).  Patient is 57 year old female complains of left hand pain.  She is left-handed.  She fell off a horse yesterday.  She denies any other injuries  On physical exam the patient's left hand is grossly bruised and swollen.  She has decreased range of motion of the fingers due to the amount of swelling.  However she is neurovascularly intact.  There is no tenderness in the wrist or elbow.  X-ray of the left hand is ordered    ----------------------------------------- 12:34 PM on 09/12/2017 -----------------------------------------  X-ray of the left hand showed a oblique fracture of the base of the metacarpal of the left thumb.  Call Dr. Rosita Kea.  He reviewed the films.  He instructed [place] to apply a thumb spica splint.  She is to call his office tomorrow.  He will do a closed  reduction and schedule her for surgery to place a pin.  All instructions were given to the patient.  She is to call Dr. Rosita Kea tomorrow.  A thumb spica splint was applied by the tech.  She is to elevate and ice the hand is much as possible.  She was given a prescription for Norco 5/325 number 20 pills with no refill.  She is to take over-the-counter Tylenol if that will control pain.  Patient states she understands will comply with our recommendations.  She was discharged in stable condition  As part of my medical decision making, I reviewed the following data within the electronic MEDICAL RECORD NUMBER Nursing notes reviewed and incorporated, Radiograph reviewed , Discussed with admitting physician Dr Rosita Kea, Notes from prior ED visits and [place] Controlled Substance Database  ____________________________________________   FINAL CLINICAL  IMPRESSION(S) / ED DIAGNOSES  Final diagnoses:  Closed Bennett's fracture of left thumb, initial encounter      NEW MEDICATIONS STARTED DURING THIS VISIT:  New Prescriptions   HYDROCODONE-ACETAMINOPHEN (NORCO/VICODIN) 5-325 MG TABLET    Take 1 tablet by mouth every 6 (six) hours as needed for moderate pain.   ONDANSETRON (ZOFRAN-ODT) 4 MG DISINTEGRATING TABLET    Take 1 tablet (4 mg total) by mouth every 8 (eight) hours as needed for nausea or vomiting.     Note:  This document was prepared using Dragon voice recognition software and may include unintentional dictation errors.    Faythe GheeFisher, Susan W, PA-C 09/12/17 1238    Willy Eddyobinson, Patrick, MD 09/12/17 [phone]

## 2017-09-13 DIAGNOSIS — S62212A Bennett's fracture, left hand, initial encounter for closed fracture: Secondary | ICD-10-CM | POA: Diagnosis not present

## 2017-09-15 ENCOUNTER — Encounter
Admission: RE | Admit: 2017-09-15 | Discharge: 2017-09-15 | Disposition: A | Discharge status: Home / Self Care | Payer: TRICARE For Life (TFL) | Source: Ambulatory Visit | Attending: Orthopedic Surgery | Admitting: Orthopedic Surgery

## 2017-09-15 ENCOUNTER — Other Ambulatory Visit: Payer: Self-pay

## 2017-09-15 DIAGNOSIS — Z01812 Encounter for preprocedural laboratory examination: Secondary | ICD-10-CM | POA: Diagnosis not present

## 2017-09-15 LAB — HEMOGLOBIN: Hemoglobin: 14.8 g/dL (ref 12.0–16.0)

## 2017-09-15 NOTE — Patient Instructions (Signed)
Your procedure is scheduled on: Thursday 09/16/17 Report to DAY SURGERY DEPARTMENT LOCATED ON 2ND FLOOR MEDICAL MALL ENTRANCE. At 8:30 am To find out your arrival time please call [phone] .  Remember: Instructions that are not followed completely may result in serious medical risk, up to and including death, or upon the discretion of your surgeon and anesthesiologist your surgery may need to be rescheduled.     _X__ 1. Do not eat food after midnight the night before your procedure.                 No gum chewing or hard candies. You may drink clear liquids up to 2 hours                 before you are scheduled to arrive for your surgery- DO not drink clear                 liquids within 2 hours of the start of your surgery.                 Clear Liquids include:  water, apple juice without pulp, clear carbohydrate                 drink such as Clearfast of Gartorade, Black Coffee or Tea (Do not add                 anything to coffee or tea).  __X__2.  On the morning of surgery brush your teeth with toothpaste and water, you                 may rinse your mouth with mouthwash if you wish.  Do not swallow any              toothpaste of mouthwash.     _X__ 3.  No Alcohol for 24 hours before or after surgery.   _X__ 4.  Do Not Smoke or use e-cigarettes For 24 Hours Prior to Your Surgery.                 Do not use any chewable tobacco products for at least 6 hours prior to                 surgery.  ____  5.  Bring all medications with you on the day of surgery if instructed.   ____  6.  Notify your doctor if there is any change in your medical condition      (cold, fever, infections).     Do not wear jewelry, make-up, hairpins, clips or nail polish. Do not wear lotions, powders, or perfumes. You may wear deodorant. Do not shave 48 hours prior to surgery. Men may shave face and neck. Do not bring valuables to the hospital.    [facility] is not responsible for any belongings or  valuables.  Contacts, dentures or bridgework may not be worn into surgery. Leave your suitcase in the car. After surgery it may be brought to your room. For patients admitted to the hospital, discharge time is determined by your treatment team.   Patients discharged the day of surgery will not be allowed to drive home.   Please read over the following fact sheets that you were given:   MRSA Information   __X__ Take these medicines the morning of surgery with A SIP OF WATER:    1. EFFEXOR  2. MAY TAKE HYDROCODONE IF NEEDED  3.   4.  5.  6.  ____ Fleet Enema (as directed)   __X__ Use CHG Soap as directed  __X__ Use inhalers on the day of surgery  ____ Stop metformin/Janumet/Farxiga 2 days prior to surgery    ____ Take 1/2 of usual insulin dose the night before surgery. No insulin the morning          of surgery.   ____ Stop Blood Thinners Coumadin/Plavix/Xarelto/Pleta/Pradaxa/Eliquis/Effient/Aspirin  on   __X__ Stop Anti-inflammatories such as Advil, Ibuprofen, Motrin, BC or Goodies  Powder, Naprosyn, Naproxen, Aleve    __X__ Stop herbal supplements, fish oil or vitamin E until after surgery.    ____ Bring C-Pap to the hospital.

## 2017-09-16 ENCOUNTER — Ambulatory Visit: Payer: Medicare Other | Admitting: Anesthesiology

## 2017-09-16 ENCOUNTER — Encounter
Admission: RE | Disposition: A | Discharge status: Home / Self Care | Payer: Self-pay | Source: Ambulatory Visit | Attending: Orthopedic Surgery

## 2017-09-16 ENCOUNTER — Ambulatory Visit
Admission: RE | Admit: 2017-09-16 | Discharge: 2017-09-16 | Disposition: A | Discharge status: Home / Self Care | Payer: Medicare Other | Source: Ambulatory Visit | Attending: Orthopedic Surgery | Admitting: Orthopedic Surgery

## 2017-09-16 ENCOUNTER — Ambulatory Visit: Payer: Medicare Other

## 2017-09-16 ENCOUNTER — Other Ambulatory Visit: Payer: Self-pay

## 2017-09-16 DIAGNOSIS — S62212A Bennett's fracture, left hand, initial encounter for closed fracture: Secondary | ICD-10-CM | POA: Diagnosis not present

## 2017-09-16 DIAGNOSIS — F329 Major depressive disorder, single episode, unspecified: Secondary | ICD-10-CM | POA: Diagnosis not present

## 2017-09-16 DIAGNOSIS — Z885 Allergy status to narcotic agent status: Secondary | ICD-10-CM | POA: Diagnosis not present

## 2017-09-16 DIAGNOSIS — Z79899 Other long term (current) drug therapy: Secondary | ICD-10-CM | POA: Diagnosis not present

## 2017-09-16 DIAGNOSIS — G8918 Other acute postprocedural pain: Secondary | ICD-10-CM

## 2017-09-16 DIAGNOSIS — S42009A Fracture of unspecified part of unspecified clavicle, initial encounter for closed fracture: Secondary | ICD-10-CM | POA: Diagnosis not present

## 2017-09-16 DIAGNOSIS — S62317A Displaced fracture of base of fifth metacarpal bone. left hand, initial encounter for closed fracture: Secondary | ICD-10-CM | POA: Diagnosis not present

## 2017-09-16 HISTORY — PX: CLOSED REDUCTION FINGER WITH PERCUTANEOUS PINNING: SHX5612

## 2017-09-16 SURGERY — CLOSED REDUCTION, FINGER, WITH PERCUTANEOUS PINNING
Anesthesia: General | Laterality: Left

## 2017-09-16 MED ORDER — FENTANYL CITRATE (PF) 100 MCG/2ML IJ SOLN
25.0000 ug | INTRAMUSCULAR | Status: DC | PRN
  Administered 2017-09-16 (×4): 25 ug via INTRAVENOUS

## 2017-09-16 MED ORDER — MIDAZOLAM HCL 2 MG/2ML IJ SOLN
INTRAMUSCULAR | Status: AC
  Filled 2017-09-16: qty 2

## 2017-09-16 MED ORDER — FENTANYL CITRATE (PF) 100 MCG/2ML IJ SOLN
INTRAMUSCULAR | Status: AC
  Administered 2017-09-16: 25 ug via INTRAVENOUS
  Filled 2017-09-16: qty 2

## 2017-09-16 MED ORDER — FENTANYL CITRATE (PF) 100 MCG/2ML IJ SOLN
INTRAMUSCULAR | Status: DC | PRN
  Administered 2017-09-16: 50 ug via INTRAVENOUS
  Administered 2017-09-16: 25 ug via INTRAVENOUS

## 2017-09-16 MED ORDER — GLYCOPYRROLATE 0.2 MG/ML IJ SOLN
INTRAMUSCULAR | Status: DC | PRN
  Administered 2017-09-16: 0.2 mg via INTRAVENOUS

## 2017-09-16 MED ORDER — FENTANYL CITRATE (PF) 100 MCG/2ML IJ SOLN
INTRAMUSCULAR | Status: AC
  Filled 2017-09-16: qty 2

## 2017-09-16 MED ORDER — ONDANSETRON HCL 4 MG/2ML IJ SOLN
INTRAMUSCULAR | Status: DC | PRN
  Administered 2017-09-16: 4 mg via INTRAVENOUS

## 2017-09-16 MED ORDER — DEXAMETHASONE SODIUM PHOSPHATE 10 MG/ML IJ SOLN
INTRAMUSCULAR | Status: DC | PRN
  Administered 2017-09-16: 5 mg via INTRAVENOUS

## 2017-09-16 MED ORDER — FAMOTIDINE 20 MG PO TABS
ORAL_TABLET | ORAL | Status: AC
  Filled 2017-09-16: qty 1

## 2017-09-16 MED ORDER — BUPIVACAINE HCL (PF) 0.5 % IJ SOLN
INTRAMUSCULAR | Status: AC
  Filled 2017-09-16: qty 30

## 2017-09-16 MED ORDER — LIDOCAINE HCL (CARDIAC) 20 MG/ML IV SOLN
INTRAVENOUS | Status: DC | PRN
  Administered 2017-09-16: 60 mg via INTRAVENOUS

## 2017-09-16 MED ORDER — HYDROCODONE-ACETAMINOPHEN 5-325 MG PO TABS: 1.0000 | ORAL_TABLET | Freq: Four times a day (QID) | ORAL | 0 refills | Status: DC | PRN

## 2017-09-16 MED ORDER — FAMOTIDINE 20 MG PO TABS
20.0000 mg | ORAL_TABLET | Freq: Once | ORAL | Status: AC
  Administered 2017-09-16: 20 mg via ORAL

## 2017-09-16 MED ORDER — ONDANSETRON HCL 4 MG/2ML IJ SOLN: 4.0000 mg | Freq: Once | INTRAMUSCULAR | Status: DC | PRN

## 2017-09-16 MED ORDER — MIDAZOLAM HCL 2 MG/2ML IJ SOLN
INTRAMUSCULAR | Status: DC | PRN
  Administered 2017-09-16: 2 mg via INTRAVENOUS

## 2017-09-16 MED ORDER — BUPIVACAINE HCL (PF) 0.5 % IJ SOLN
INTRAMUSCULAR | Status: DC | PRN
  Administered 2017-09-16: 5 mL

## 2017-09-16 MED ORDER — LACTATED RINGERS IV SOLN
INTRAVENOUS | Status: DC
  Administered 2017-09-16: 09:00:00 via INTRAVENOUS

## 2017-09-16 MED ORDER — PROPOFOL 10 MG/ML IV BOLUS
INTRAVENOUS | Status: DC | PRN
  Administered 2017-09-16: 30 mg via INTRAVENOUS
  Administered 2017-09-16: 150 mg via INTRAVENOUS

## 2017-09-16 MED ORDER — GLYCOPYRROLATE 0.2 MG/ML IJ SOLN
INTRAMUSCULAR | Status: AC
  Filled 2017-09-16: qty 1

## 2017-09-16 MED ORDER — LACTATED RINGERS IV SOLN
INTRAVENOUS | Status: DC | PRN
  Administered 2017-09-16: 11:00:00 via INTRAVENOUS

## 2017-09-16 MED ORDER — LIDOCAINE HCL (PF) 2 % IJ SOLN
INTRAMUSCULAR | Status: AC
  Filled 2017-09-16: qty 10

## 2017-09-16 MED ORDER — PROPOFOL 10 MG/ML IV BOLUS
INTRAVENOUS | Status: AC
  Filled 2017-09-16: qty 20

## 2017-09-16 SURGICAL SUPPLY — 26 items
BANDAGE ACE 3X5.8 VEL STRL LF (GAUZE/BANDAGES/DRESSINGS) ×3 IMPLANT
BNDG GAUZE 1X2.1 STRL (MISCELLANEOUS) ×3 IMPLANT
CAST PADDING 2X4YD ST 30245 (MISCELLANEOUS) ×2
CHLORAPREP W/TINT 26ML (MISCELLANEOUS) ×3 IMPLANT
CUFF TOURN 18 STER (MISCELLANEOUS) IMPLANT
DRAPE FLUOR MINI C-ARM 54X84 (DRAPES) ×3 IMPLANT
ELECT REM PT RETURN 9FT ADLT (ELECTROSURGICAL) ×3
ELECTRODE REM PT RTRN 9FT ADLT (ELECTROSURGICAL) ×1 IMPLANT
GAUZE PETRO XEROFOAM 1X8 (MISCELLANEOUS) ×3 IMPLANT
GAUZE SPONGE 4X4 12PLY STRL (GAUZE/BANDAGES/DRESSINGS) ×3 IMPLANT
GAUZE SPONGE NON-WVN 2X2 STRL (MISCELLANEOUS) ×1 IMPLANT
GLOVE SURG SYN 9.0  PF PI (GLOVE) ×2
GLOVE SURG SYN 9.0 PF PI (GLOVE) ×1 IMPLANT
GOWN SRG 2XL LVL 4 RGLN SLV (GOWNS) ×1 IMPLANT
GOWN STRL NON-REIN 2XL LVL4 (GOWNS) ×2
GOWN STRL REUS W/ TWL LRG LVL3 (GOWN DISPOSABLE) ×1 IMPLANT
GOWN STRL REUS W/TWL LRG LVL3 (GOWN DISPOSABLE) ×2
KIT RM TURNOVER STRD PROC AR (KITS) ×3 IMPLANT
NS IRRIG 500ML POUR BTL (IV SOLUTION) ×3 IMPLANT
PACK EXTREMITY ARMC (MISCELLANEOUS) ×3 IMPLANT
PAD PREP 24X41 OB/GYN DISP (PERSONAL CARE ITEMS) ×3 IMPLANT
PADDING CAST COTTON 2X4 ST (MISCELLANEOUS) ×1 IMPLANT
SPONGE VERSALON 2X2 STRL (MISCELLANEOUS) ×2
SUT ETHIBOND 4-0 (SUTURE) IMPLANT
SUT ETHILON 5-0 FS-2 18 BLK (SUTURE) IMPLANT
zimmer pin ×6 IMPLANT

## 2017-09-16 NOTE — H&P (Signed)
Reviewed paper H+P, will be scanned into chart. No changes noted.  

## 2017-09-16 NOTE — Anesthesia Preprocedure Evaluation (Signed)
Anesthesia Evaluation  Patient identified by MRN, date of birth, ID band Patient awake    Reviewed: Allergy & Precautions, NPO status , Patient's Chart, lab work & pertinent test results, reviewed documented beta blocker date and time   Airway Mallampati: III  TM Distance: >3 FB     Dental  (+) Chipped   Pulmonary           Cardiovascular      Neuro/Psych    GI/Hepatic   Endo/Other    Renal/GU      Musculoskeletal   Abdominal   Peds  Hematology   Anesthesia Other Findings   Reproductive/Obstetrics                             Anesthesia Physical Anesthesia Plan  ASA: III  Anesthesia Plan: General   Post-op Pain Management:    Induction: Intravenous  PONV Risk Score and Plan:   Airway Management Planned: LMA  Additional Equipment:   Intra-op Plan:   Post-operative Plan:   Informed Consent: I have reviewed the patients History and Physical, chart, labs and discussed the procedure including the risks, benefits and alternatives for the proposed anesthesia with the patient or authorized representative who has indicated his/her understanding and acceptance.     Plan Discussed with: CRNA  Anesthesia Plan Comments:         Anesthesia Quick Evaluation

## 2017-09-16 NOTE — Op Note (Signed)
09/16/2017  11:43 AM  PATIENT:  Frances Stanley  57 y.o. female  PRE-OPERATIVE DIAGNOSIS:  closed bennett fracture of left thumb  POST-OPERATIVE DIAGNOSIS:  closed bennett fracture of left thumb  PROCEDURE:  Procedure(s): CLOSED REDUCTION FINGER WITH PERCUTANEOUS PINNING (Left)  SURGEON: Leitha SchullerMichael J Baldemar Dady, MD  ASSISTANTS: none  ANESTHESIA:   general  EBL:  Total I/O In: 500 [I.V.:500] Out: 1 [Blood:1]  BLOOD ADMINISTERED:none  DRAINS: none   LOCAL MEDICATIONS USED:  MARCAINE     SPECIMEN:  No Specimen  DISPOSITION OF SPECIMEN:  N/A  COUNTS:  YES  TOURNIQUET:  * No tourniquets in log *  IMPLANTS: k wire x 2  DICTATION: .Dragon Dictation  Patient was brought to the operating room and after adequate anesthesia was obtained the arm was prepped and draped in usual sterile fashion. After patient identification and timeout procedures were completed closure reduction was performed under fluoroscopic guidance using the mini C-arm. Anatomic reduction was obtained and 2 K wires inserted percutaneously through the the first metacarpal shaft into the base of the second and carpus. The pins were cut short and bent over followed by Xeroform around the base of the pins 4 x 4's web roll and a thumb spica splint and Ace wrap was then applied  PLAN OF CARE: Discharge to home after PACU  PATIENT DISPOSITION:  PACU - hemodynamically stable.

## 2017-09-16 NOTE — Transfer of Care (Signed)
Immediate Anesthesia Transfer of Care Note  Patient: Frances Stanley  Procedure(s) Performed: CLOSED REDUCTION FINGER WITH PERCUTANEOUS PINNING (Left )  Patient Location: PACU  Anesthesia Type:General  Level of Consciousness: drowsy and patient cooperative  Airway & Oxygen Therapy: Patient Spontanous Breathing and Patient connected to face mask oxygen  Post-op Assessment: Report given to RN and Post -op Vital signs reviewed and stable  Post vital signs: Reviewed and stable  Last Vitals:  Vitals:   09/16/17 0835 09/16/17 1141  BP: (!) 195/94   Resp: 16   Temp: (!) 36 C 36.4 C  SpO2: 100%     Last Pain:  Vitals:   09/16/17 0835  TempSrc: Temporal  PainSc: 2          Complications: No apparent anesthesia complications

## 2017-09-16 NOTE — Anesthesia Postprocedure Evaluation (Signed)
Anesthesia Post Note  Patient: Frances Stanley  Procedure(s) Performed: CLOSED REDUCTION FINGER WITH PERCUTANEOUS PINNING (Left )  Patient location during evaluation: PACU Anesthesia Type: General Level of consciousness: awake and alert Pain management: pain level controlled Vital Signs Assessment: post-procedure vital signs reviewed and stable Respiratory status: spontaneous breathing, nonlabored ventilation, respiratory function stable and patient connected to nasal cannula oxygen Cardiovascular status: blood pressure returned to baseline and stable Postop Assessment: no apparent nausea or vomiting Anesthetic complications: no     Last Vitals:  Vitals:   09/16/17 1229 09/16/17 1314  BP: (!) 174/81 (!) 179/73  Pulse: 64 (!) 53  Resp: 14 16  Temp: (!) 36.3 C   SpO2: 100% 100%    Last Pain:  Vitals:   09/16/17 1314  TempSrc:   PainSc: 2                  Densil Ottey S

## 2017-09-16 NOTE — Discharge Instructions (Addendum)
AMBULATORY SURGERY  DISCHARGE INSTRUCTIONS   1) The drugs that you were given will stay in your system until tomorrow so for the next 24 hours you should not:  A) Drive an automobile B) Make any legal decisions C) Drink any alcoholic beverage   2) You may resume regular meals tomorrow.  Today it is better to start with liquids and gradually work up to solid foods.  You may eat anything you prefer, but it is better to start with liquids, then soup and crackers, and gradually work up to solid foods.   3) Please notify your doctor immediately if you have any unusual bleeding, trouble breathing, redness and pain at the surgery site, drainage, fever, or pain not relieved by medication. 4)   5) Your post-operative visit with Dr.                                     is: Date:                        Time:    Please call to schedule your post-operative visit.  6) Additional Instructions:     Keep arm elevated today. Pain medicine as directed. Ice to the hand may help with pain. Work on finger motion.

## 2017-09-16 NOTE — Anesthesia Post-op Follow-up Note (Signed)
Anesthesia QCDR form completed.        

## 2017-09-16 NOTE — Anesthesia Procedure Notes (Signed)
Procedure Name: LMA Insertion Date/Time: 09/16/2017 11:10 AM Performed by: Lily KocherPeralta, Yogi Arther, CRNA Pre-anesthesia Checklist: Patient identified, Patient being monitored, Timeout performed, Emergency Drugs available and Suction available Patient Re-evaluated:Patient Re-evaluated prior to induction Oxygen Delivery Method: Circle system utilized Preoxygenation: Pre-oxygenation with 100% oxygen Induction Type: IV induction Ventilation: Mask ventilation without difficulty LMA: LMA inserted LMA Size: 4.0 Tube type: Oral Number of attempts: 1 Placement Confirmation: positive ETCO2 and breath sounds checked- equal and bilateral Tube secured with: Tape Dental Injury: Teeth and Oropharynx as per pre-operative assessment

## 2017-10-07 DIAGNOSIS — S62212A Bennett's fracture, left hand, initial encounter for closed fracture: Secondary | ICD-10-CM | POA: Diagnosis not present

## 2017-10-18 DIAGNOSIS — S62212A Bennett's fracture, left hand, initial encounter for closed fracture: Secondary | ICD-10-CM | POA: Diagnosis not present

## 2017-11-17 DIAGNOSIS — S62212D Bennett's fracture, left hand, subsequent encounter for fracture with routine healing: Secondary | ICD-10-CM | POA: Diagnosis not present

## 2018-03-02 DIAGNOSIS — R1013 Epigastric pain: Secondary | ICD-10-CM | POA: Diagnosis not present

## 2018-03-02 DIAGNOSIS — A09 Infectious gastroenteritis and colitis, unspecified: Secondary | ICD-10-CM | POA: Diagnosis not present

## 2018-03-04 DIAGNOSIS — A09 Infectious gastroenteritis and colitis, unspecified: Secondary | ICD-10-CM | POA: Diagnosis not present

## 2018-03-04 DIAGNOSIS — R1013 Epigastric pain: Secondary | ICD-10-CM | POA: Diagnosis not present

## 2018-04-24 DIAGNOSIS — S52122A Displaced fracture of head of left radius, initial encounter for closed fracture: Secondary | ICD-10-CM | POA: Diagnosis not present

## 2018-04-24 DIAGNOSIS — M79602 Pain in left arm: Secondary | ICD-10-CM | POA: Diagnosis not present

## 2018-04-24 DIAGNOSIS — S52202A Unspecified fracture of shaft of left ulna, initial encounter for closed fracture: Secondary | ICD-10-CM | POA: Diagnosis not present

## 2018-04-24 DIAGNOSIS — S6992XA Unspecified injury of left wrist, hand and finger(s), initial encounter: Secondary | ICD-10-CM | POA: Diagnosis not present

## 2018-04-24 DIAGNOSIS — Y33XXXA Other specified events, undetermined intent, initial encounter: Secondary | ICD-10-CM | POA: Diagnosis not present

## 2018-05-03 DIAGNOSIS — M25522 Pain in left elbow: Secondary | ICD-10-CM | POA: Diagnosis not present

## 2018-05-03 DIAGNOSIS — S52125A Nondisplaced fracture of head of left radius, initial encounter for closed fracture: Secondary | ICD-10-CM | POA: Diagnosis not present

## 2018-05-24 DIAGNOSIS — M25522 Pain in left elbow: Secondary | ICD-10-CM | POA: Diagnosis not present

## 2018-05-24 DIAGNOSIS — S52125D Nondisplaced fracture of head of left radius, subsequent encounter for closed fracture with routine healing: Secondary | ICD-10-CM | POA: Diagnosis not present

## 2018-06-23 DIAGNOSIS — S52125D Nondisplaced fracture of head of left radius, subsequent encounter for closed fracture with routine healing: Secondary | ICD-10-CM | POA: Diagnosis not present

## 2018-06-23 DIAGNOSIS — W010XXD Fall on same level from slipping, tripping and stumbling without subsequent striking against object, subsequent encounter: Secondary | ICD-10-CM | POA: Diagnosis not present

## 2019-06-11 IMAGING — DX DG HAND 2V*L*
2 series · 2 of 2 positions shown · non-contrast
Comparison: 09/12/2017

CLINICAL DATA: Postop.

EXAM:
LEFT HAND - 2 VIEW

[hand ap]
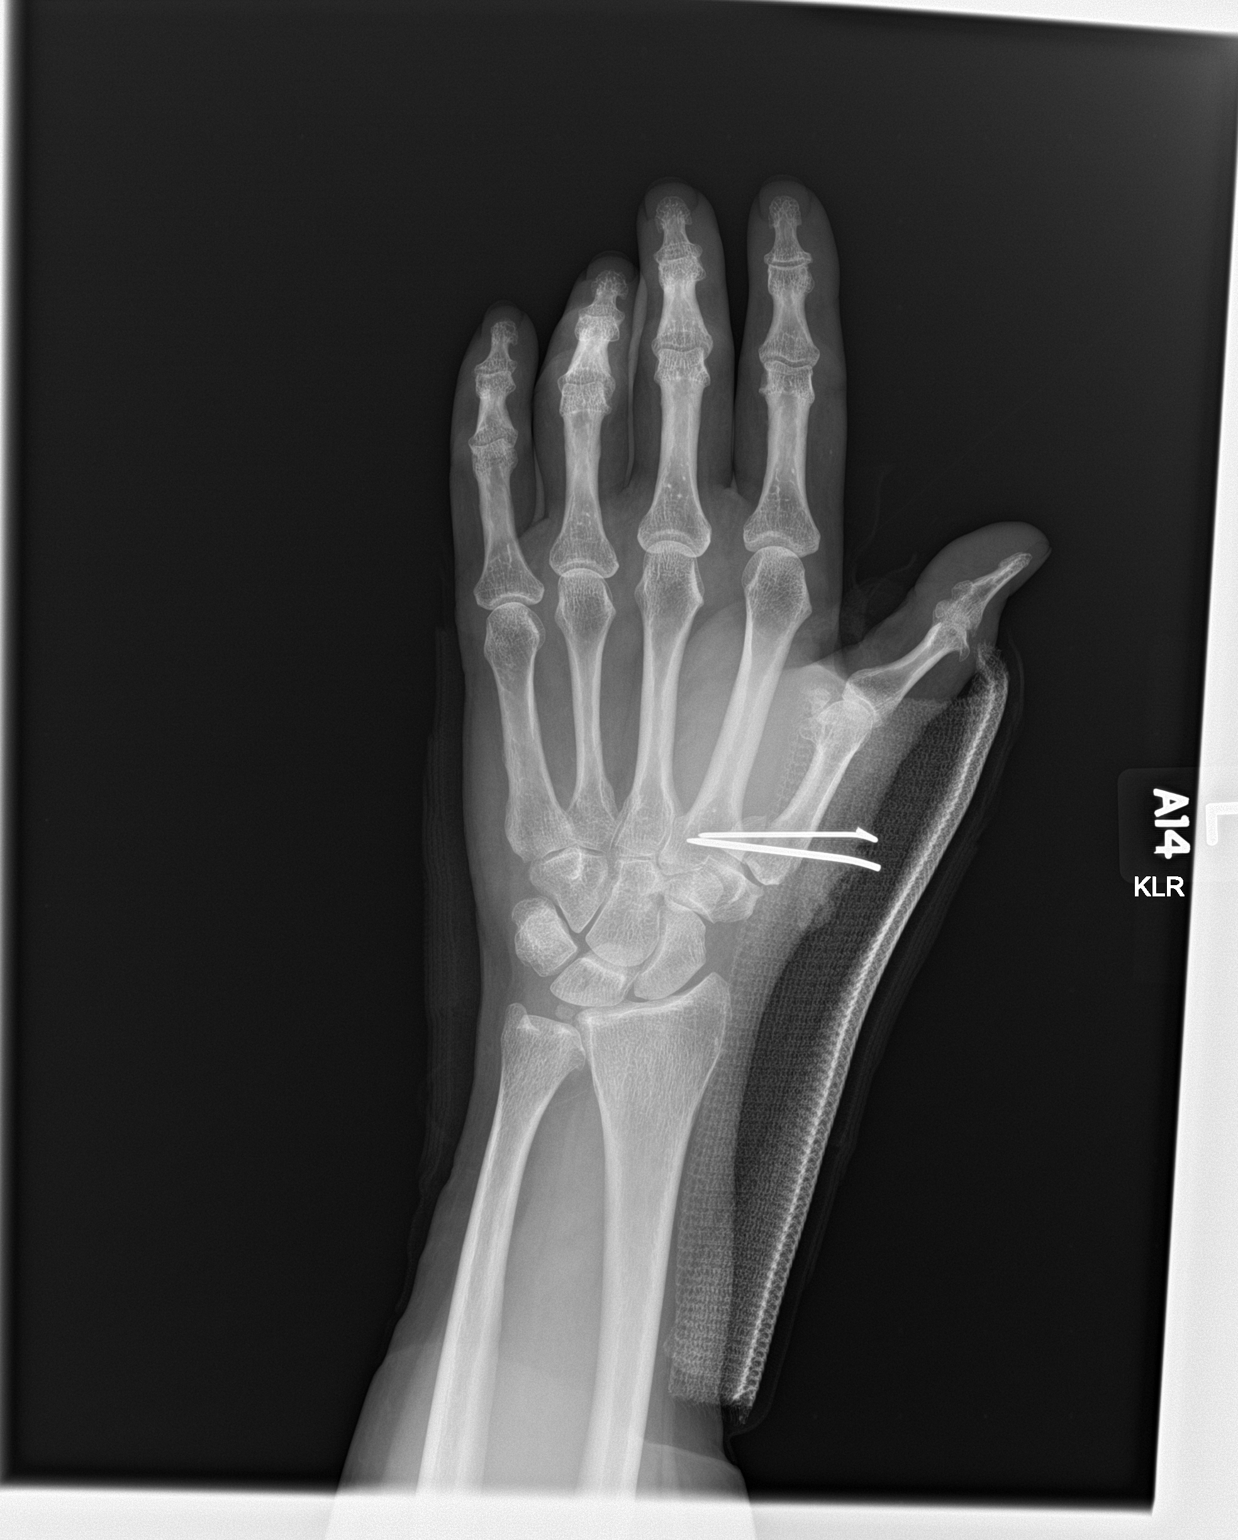

[hand lat]
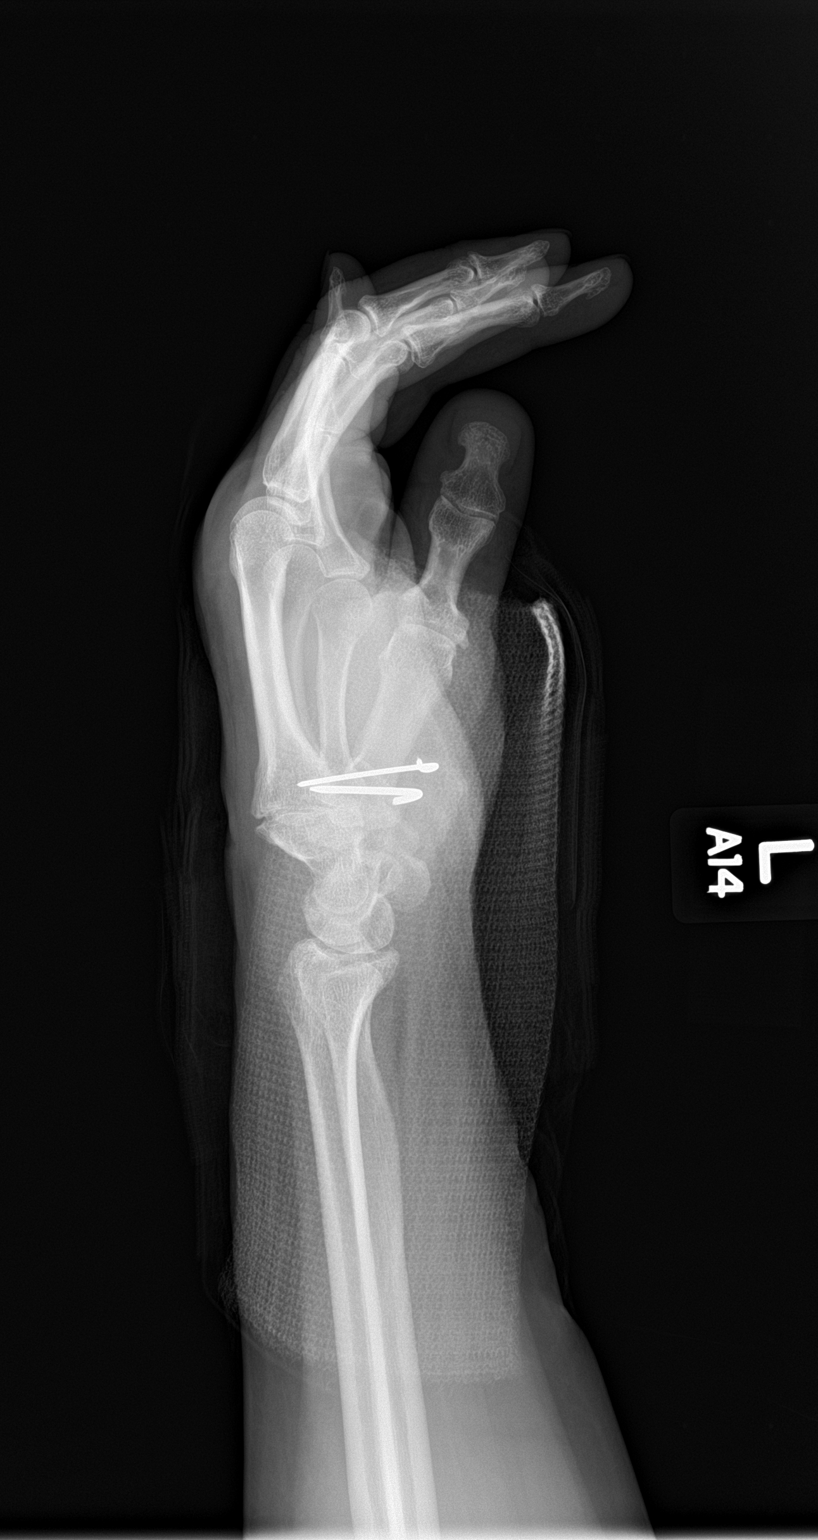

[2 of 2 positions shown; findings below may reference images not displayed]

FINDINGS: K-wires traverse the base of the first and 2nd metacarpal,
transfixing a comminuted fracture at the base of first metacarpal.
There is splinting material in place, obscuring detail.
IMPRESSION: Status post ORIF of the base of the first metacarpal.

## 2019-09-21 ENCOUNTER — Ambulatory Visit: Payer: Medicare Other

## 2019-09-21 ENCOUNTER — Ambulatory Visit
Admission: EM | Admit: 2019-09-21 | Discharge: 2019-09-21 | Disposition: A | Discharge status: Home / Self Care | Payer: Medicare Other | Attending: Family Medicine | Admitting: Family Medicine

## 2019-09-21 ENCOUNTER — Other Ambulatory Visit: Payer: Self-pay

## 2019-09-21 DIAGNOSIS — R509 Fever, unspecified: Secondary | ICD-10-CM | POA: Diagnosis not present

## 2019-09-21 DIAGNOSIS — R05 Cough: Secondary | ICD-10-CM | POA: Diagnosis not present

## 2019-09-21 DIAGNOSIS — R062 Wheezing: Secondary | ICD-10-CM

## 2019-09-21 DIAGNOSIS — Z7189 Other specified counseling: Secondary | ICD-10-CM | POA: Diagnosis present

## 2019-09-21 DIAGNOSIS — R0981 Nasal congestion: Secondary | ICD-10-CM | POA: Diagnosis not present

## 2019-09-21 DIAGNOSIS — Z20822 Contact with and (suspected) exposure to covid-19: Secondary | ICD-10-CM | POA: Diagnosis present

## 2019-09-21 DIAGNOSIS — R0602 Shortness of breath: Secondary | ICD-10-CM

## 2019-09-21 DIAGNOSIS — R5383 Other fatigue: Secondary | ICD-10-CM | POA: Diagnosis not present

## 2019-09-21 MED ORDER — PREDNISONE 20 MG PO TABS: 40.0000 mg | ORAL_TABLET | Freq: Every day | ORAL | 0 refills | Status: DC

## 2019-09-21 MED ORDER — HYDROCOD POLST-CPM POLST ER 10-8 MG/5ML PO SUER: 5.0000 mL | Freq: Two times a day (BID) | ORAL | 0 refills | Status: DC | PRN

## 2019-09-21 MED ORDER — ALBUTEROL SULFATE HFA 108 (90 BASE) MCG/ACT IN AERS: 2.0000 | INHALATION_SPRAY | RESPIRATORY_TRACT | 0 refills | Status: DC | PRN

## 2019-09-21 NOTE — ED Triage Notes (Signed)
Pt presents with c/o possible COVID exposure through her husband, who has several coworkers that have tested positive. Pt reports this morning she woke up cough, elevated temp (100.4), wheezing and shob w/exertion, chills, nausea. She did take some Tylenol this morning with some improvement.

## 2019-09-21 NOTE — Discharge Instructions (Addendum)
It was very nice seeing you today in clinic. Thank you for entrusting me with your care.  Rest and stay HYDRATED. Water and electrolyte containing beverages (Gatorade, Pedialyte) are best to prevent dehydration and electrolyte abnormalities.  May use Tylenol and/or Ibuprofen as needed for pain/fever.  Please utilize the medications that we discussed. Your prescriptions has been called in to your pharmacy. Remember, exercise caution with the cough medication as it can make you sleepy.  You were tested for SARS-CoV-2 (novel coronavirus) today. Testing is performed by an outside lab ([facility]) and has variable turn around times ranging between 2-5 days. Current recommendations from the the [facility] and [facility] DHHS require that you remain at home until negative test results are have been received. In the event that your test results are positive, you will be contacted with further directives. These measures are being implemented out of an abundance of caution to prevent transmission and spread during the current SARS-CoV-2 pandemic.  Make arrangements to follow up with your regular doctor in 1 week for re-evaluation if not improving. If your symptoms/condition worsens, please seek follow up care either here or in the ER. Please remember, our [facility] providers are "right here with you" when you need [place].   Again, it was my pleasure to take care of you today. Thank you for choosing our clinic. I hope that you start to feel better quickly.   Quentin Mulling, MSN, APRN, FNP-C, CEN Advanced Practice Provider [facility]

## 2019-09-21 NOTE — ED Provider Notes (Signed)
[place]   Name: Frances Stanley DOB: [birth year] MRN: [id number] CSN: [id number] PCP: [facility]  Arrival date and time:  09/21/19 1250  Chief Complaint:  Cough   NOTE: Prior to seeing the patient today, I have reviewed the triage nursing documentation and vital signs. Clinical staff has updated patient's PMH/PSHx, current medication list, and drug allergies/intolerances to ensure comprehensive history available to assist in medical decision making.   History:   HPI: Frances Stanley is a 59 y.o. female who presents today with complaints of fatigue, cough, congestion, and diffuse myalgias that started with acute onset yesterday. Patient reports fevers; Tmax has been 100.4. Cough has been non-productive. She has been short of breath and wheezing; breathing is worse with exertion. PMH (+) for seasonal allergies; does not take daily intervention. Patient has had some mild nausea associated with her forceful coughing. She denies that she has experienced any vomiting, diarrhea, or abdominal pain. She is eating and drinking well. Patient denies any perceived alterations to her sense of taste or smell. Patient presents out of concerns for her personal health after an indirect exposure to SARS-CoV-2 (novel coronavirus). Patient reports that her husband was exposed to 2 individuals who tested positive for the virus. He was never tested, however has been on quarantine for the past 12 days. Patient denies being in close contact with anyone known to be ill; no one else is her home has experienced a similar symptom constellation. She has never been tested for SARS-CoV-2 (novel coronavirus) in the past per her report. Patient has been vaccinated for influenza this season. In efforts to conservatively manage her symptoms at home, the patient notes that she has used APAP and a [employer], which have not really helped to improve her symptoms.    Past Medical History:  Diagnosis Date  . Clavicle  fracture   . Depression     Past Surgical History:  Procedure Laterality Date  . APPENDECTOMY    . BREAST ENHANCEMENT SURGERY Bilateral    and removal  . BREAST SURGERY    . CESAREAN SECTION    . CHOLECYSTECTOMY    . CLAVICLE SURGERY    . CLOSED REDUCTION FINGER WITH PERCUTANEOUS PINNING Left 09/16/2017   Procedure: CLOSED REDUCTION FINGER WITH PERCUTANEOUS PINNING;  Surgeon: Kennedy Bucker, MD;  Location: [facility] ORS;  Service: Orthopedics;  Laterality: Left;    History reviewed. No pertinent family history.  Social History   Tobacco Use  . Smoking status: Never Smoker  . Smokeless tobacco: Never Used  Substance Use Topics  . Alcohol use: Yes  . Drug use: No    There are no problems to display for this patient.   Home Medications:    Current Meds  Medication Sig  . acetaminophen (TYLENOL) 500 MG tablet Take 500 mg by mouth every 8 (eight) hours as needed for mild pain or headache.  . bismuth subsalicylate (PEPTO BISMOL) 262 MG/15ML suspension Take 30 mLs by mouth 3 (three) times daily as needed for indigestion.  . carboxymethylcellulose (REFRESH PLUS) 0.5 % SOLN Place 1 drop into both eyes 3 (three) times daily as needed (dry eyes).    Allergies:   Morphine and related  Review of Systems (ROS): Review of Systems  Constitutional: Positive for fatigue and fever.  HENT: Positive for congestion. Negative for ear pain, postnasal drip, rhinorrhea, sinus pressure, sinus pain, sneezing and sore throat.   Eyes: Negative for pain, discharge and redness.  Respiratory: Positive for cough, chest  tightness, shortness of breath and wheezing.   Cardiovascular: Negative for chest pain and palpitations.  Gastrointestinal: Positive for nausea. Negative for abdominal pain, diarrhea and vomiting.  Musculoskeletal: Positive for myalgias. Negative for arthralgias, back pain and neck pain.  Skin: Negative for color change, pallor and rash.  Allergic/Immunologic: Positive for environmental  allergies.  Neurological: Negative for dizziness, syncope, weakness and headaches.  Hematological: Negative for adenopathy.  Psychiatric/Behavioral: Positive for sleep disturbance (2/2 cough).     Vital Signs: Today's Vitals   09/21/19 1306 09/21/19 1311 09/21/19 1414  BP:  (!) 159/80   Pulse:  74   Temp:  98.2 F (36.8 C)   TempSrc:  Oral   SpO2:  100%   Weight: 200 lb (90.7 kg)    Height: 5\' 7"  (1.702 m)    PainSc: 0-No pain  0-No pain    Physical Exam: Physical Exam  Constitutional: She is oriented to person, place, and time and well-developed, well-nourished, and in no distress.  Acutely ill appearing; fatigued/listless.  HENT:  Head: Normocephalic and atraumatic.  Right Ear: Tympanic membrane normal.  Left Ear: Tympanic membrane normal.  Nose: Mucosal edema and rhinorrhea present. No sinus tenderness.  Mouth/Throat: Uvula is midline and mucous membranes are normal. Posterior oropharyngeal erythema present. No oropharyngeal exudate or posterior oropharyngeal edema.  Eyes: Pupils are equal, round, and reactive to light.  Cardiovascular: Normal rate, regular rhythm, normal heart sounds and intact distal pulses.  Pulmonary/Chest: Effort normal. She has wheezes (scattered expiratory). She has rhonchi (upper airways; clears mostly with cough).  Significant deep cough noted in clinic. No SOB or increased WOB. No distress. Able to speak in complete sentences without difficulties. SPO2 100% on RA.  Neurological: She is alert and oriented to person, place, and time. Gait normal.  Skin: Skin is warm and dry. No rash noted. She is not diaphoretic.  Psychiatric: Mood, memory, affect and judgment normal.  Nursing note and vitals reviewed.   Urgent Care Treatments / Results:   Orders Placed This Encounter  Procedures  . Novel Coronavirus, NAA (Hosp order, Send-out to Ref Lab; TAT 18-24 hrs  . DG Chest 2 View    LABS: PLEASE NOTE: all labs that were ordered this encounter are  listed, however only abnormal results are displayed. Labs Reviewed  NOVEL CORONAVIRUS, NAA (HOSP ORDER, SEND-OUT TO REF LAB; TAT 18-24 HRS)    EKG: -None  RADIOLOGY: DG Chest 2 View  Result Date: 09/21/2019 CLINICAL DATA:  Cough, shortness of breath, wheezing, COVID-19 exposure EXAM: CHEST - 2 VIEW COMPARISON:  07/24/2011 FINDINGS: Upper normal heart size. Mediastinal contours and pulmonary vascularity normal. Lungs clear. No pulmonary infiltrate, pleural effusion or pneumothorax. Interval ORIF of LEFT clavicle. No acute osseous findings. Surgical clips RIGHT upper quadrant likely reflect prior cholecystectomy. IMPRESSION: No acute abnormalities. Electronically Signed   By: 14/02/2011 M.D.   On: 09/21/2019 13:50    PROCEDURES: Procedures  MEDICATIONS RECEIVED THIS VISIT: Medications - No data to display  PERTINENT CLINICAL COURSE NOTES/UPDATES:   Initial Impression / Assessment and Plan / Urgent Care Course:  Pertinent labs & imaging results that were available during my care of the patient were personally reviewed by me and considered in my medical decision making (see lab/imaging section of note for values and interpretations).  TATIYANNA LASHLEY is a 59 y.o. female who presents to [facility] today with complaints of Cough  Patient acutely ill appearing (non-toxic) appearing in clinic today. She does not appear to be  in any acute distress. Presenting symptoms (see HPI) and exam as documented above. She presents with symptoms associated with SARS-CoV-2 (novel coronavirus) following an indirect exposure. Discussed typical symptom constellation. Reviewed potential for infection and need for testing. Patient amenable to being tested. SARS-CoV-2 swab collected by certified clinical staff. Discussed variable turn around times associated with testing, as swabs are being processed at [facility], and have been taking between 24-48 hours to come back. She was advised to self quarantine, per [facility]  DHHS guidelines, until negative results received. These measures are being implemented out of an abundance of caution to prevent transmission and spread during the current SARS-CoV-2 pandemic.  Radiographs of the chest performed today revealed no acute cardiopulmonary process; no evidence of peribronchial thickening, areas of consolidation, or focal infiltrates. Presenting symptoms consistent with acute viral illness, with high suspicion for SARS-CoV-2. Discussed that until ruled out with confirmatory lab testing, SARS-CoV-2 remains part of the differential, thus proper precautions (masking, handwashing, social distancing) need to be maintained. Her testing is pending at this time. I discussed with her that her symptoms are felt to be viral in nature, thus antibiotics would not offer her any relief or improve her symptoms any faster than conservative symptomatic management. Will treat as follows:   Systemic steroid burst (prednisone 40 mg daily x 5 days) to help with cough, SOB/DOE, chest tightness, and wheezing.    SABA inhaler (albuterol) for wheezing and chest tightness.    Cough is significant and preventing sleep. Will send in a supply of Tussionex for PRN use. She was educated on the indications and associated side effects of this medication.   Discussed supportive care measures at home during acute phase of illness. Patient to rest as much as possible. She was encouraged to ensure adequate hydration (water and ORS) to prevent dehydration and electrolyte derangements. Patient may use APAP and/or IBU on an as needed basis for pain/fever.   Discussed follow up with primary care physician in 1 week for re-evaluation. I have reviewed the follow up and strict return precautions for any new or worsening symptoms. Patient is aware of symptoms that would be deemed urgent/emergent, and would thus require further evaluation either here or in the emergency department. At the time of discharge, she  verbalized understanding and consent with the discharge plan as it was reviewed with her. All questions were fielded by provider and/or clinic staff prior to patient discharge.    Final Clinical Impressions / Urgent Care Diagnoses:   Final diagnoses:  Suspected COVID-19 virus infection  Exposure to COVID-19 virus  Encounter for laboratory testing for COVID-19 virus  Advice given about COVID-19 virus infection    New Prescriptions:  [place] Controlled Substance Registry consulted? Yes, I have consulted the [place] Controlled Substances Registry for this patient, and feel the risk/benefit ratio today is favorable for proceeding with this prescription for a controlled substance.  . Discussed use of controlled substance medication to treat her acute symptoms.  o Reviewed [place] STOP Act regulations  o Clinic does not refill controlled substances over the phone without face to face evaluation.  . Safety precautions reviewed.  o Medications should not be sold or taken with alcohol.  o Avoid use while working, driving, or operating heavy machinery.  o Side effects associated with the use of this particular medication reviewed. - Patient understands that this medication can cause CNS depression, increase her risk of falls, and even lead to overdose that may result in death, if used outside of the  parameters that she and I discussed.  With all of this in mind, she knowingly accepts the risks and responsibilities associated with intended course of treatment, and elects to responsibly proceed as discussed.  Meds ordered this encounter  Medications  . predniSONE (DELTASONE) 20 MG tablet    Sig: Take 2 tablets (40 mg total) by mouth daily.    Dispense:  10 tablet    Refill:  0  . albuterol (VENTOLIN HFA) 108 (90 Base) MCG/ACT inhaler    Sig: Inhale 2 puffs into the lungs every 4 (four) hours as needed for wheezing or shortness of breath.    Dispense:  18 g    Refill:  0  . chlorpheniramine-HYDROcodone  (TUSSIONEX PENNKINETIC ER) 10-8 MG/5ML SUER    Sig: Take 5 mLs by mouth every 12 (twelve) hours as needed for cough. Will cause drowsiness; NO DRIVING.    Dispense:  70 mL    Refill:  0    Recommended Follow up Care:  Patient encouraged to follow up with the following provider within the specified time frame, or sooner as dictated by the severity of her symptoms. As always, she was instructed that for any urgent/emergent care needs, she should seek care either here or in the emergency department for more immediate evaluation.  Follow-up Information    [place] In 1 week.   Why: General reassessment of symptoms if not improving Contact information: [address] [place] [address] [place] [address] [phone]         NOTE: This note was prepared using Dragon dictation software along with smaller phrase technology. Despite my best ability to proofread, there is the potential that transcriptional errors may still occur from this process, and are completely unintentional.    Karen Kitchens, NP 09/21/19 2328

## 2019-09-22 LAB — NOVEL CORONAVIRUS, NAA (HOSP ORDER, SEND-OUT TO REF LAB; TAT 18-24 HRS): SARS-CoV-2, NAA: NOT DETECTED

## 2020-04-29 ENCOUNTER — Other Ambulatory Visit: Payer: Self-pay

## 2020-04-29 ENCOUNTER — Emergency Department
Admission: EM | Admit: 2020-04-29 | Discharge: 2020-04-30 | Disposition: A | Discharge status: Home / Self Care | Payer: Medicare Other | Attending: Emergency Medicine | Admitting: Emergency Medicine

## 2020-04-29 DIAGNOSIS — R45851 Suicidal ideations: Secondary | ICD-10-CM | POA: Insufficient documentation

## 2020-04-29 DIAGNOSIS — F29 Unspecified psychosis not due to a substance or known physiological condition: Secondary | ICD-10-CM | POA: Insufficient documentation

## 2020-04-29 DIAGNOSIS — F209 Schizophrenia, unspecified: Secondary | ICD-10-CM | POA: Diagnosis not present

## 2020-04-29 DIAGNOSIS — Z20822 Contact with and (suspected) exposure to covid-19: Secondary | ICD-10-CM | POA: Diagnosis not present

## 2020-04-29 DIAGNOSIS — Z9119 Patient's noncompliance with other medical treatment and regimen: Secondary | ICD-10-CM | POA: Insufficient documentation

## 2020-04-29 DIAGNOSIS — Z79899 Other long term (current) drug therapy: Secondary | ICD-10-CM | POA: Diagnosis not present

## 2020-04-29 DIAGNOSIS — F32A Depression, unspecified: Secondary | ICD-10-CM

## 2020-04-29 DIAGNOSIS — F411 Generalized anxiety disorder: Secondary | ICD-10-CM | POA: Diagnosis not present

## 2020-04-29 DIAGNOSIS — F329 Major depressive disorder, single episode, unspecified: Secondary | ICD-10-CM | POA: Insufficient documentation

## 2020-04-29 HISTORY — DX: Schizophrenia, unspecified: F20.9

## 2020-04-29 LAB — COMPREHENSIVE METABOLIC PANEL
ALT: 22 U/L (ref 0–44)
AST: 25 U/L (ref 15–41)
Albumin: 4.5 g/dL (ref 3.5–5.0)
Alkaline Phosphatase: 56 U/L (ref 38–126)
Anion gap: 12 (ref 5–15)
BUN: 12 mg/dL (ref 8–23)
CO2: 26 mmol/L (ref 22–32)
Calcium: 9.7 mg/dL (ref 8.9–10.3)
Chloride: 102 mmol/L (ref 98–111)
Creatinine, Ser: 0.91 mg/dL (ref 0.44–1.00)
GFR calc Af Amer: >60 mL/min (ref >60)
GFR calc non Af Amer: >60 mL/min (ref >60)
Glucose, Bld: 112 mg/dL — ABNORMAL HIGH (ref 70–99)
Potassium: 4.5 mmol/L (ref 3.5–5.1)
Sodium: 140 mmol/L (ref 135–145)
Total Bilirubin: 0.7 mg/dL (ref 0.3–1.2)
Total Protein: 7.8 g/dL (ref 6.5–8.1)

## 2020-04-29 LAB — URINE DRUG SCREEN, QUALITATIVE (ARMC ONLY)
Amphetamines, Ur Screen: NOT DETECTED
Barbiturates, Ur Screen: NOT DETECTED
Benzodiazepine, Ur Scrn: NOT DETECTED
Cannabinoid 50 Ng, Ur ~~LOC~~: POSITIVE — AB
Cocaine Metabolite,Ur ~~LOC~~: NOT DETECTED
MDMA (Ecstasy)Ur Screen: NOT DETECTED
Methadone Scn, Ur: NOT DETECTED
Opiate, Ur Screen: NOT DETECTED
Phencyclidine (PCP) Ur S: NOT DETECTED
Tricyclic, Ur Screen: NOT DETECTED

## 2020-04-29 LAB — ACETAMINOPHEN LEVEL: Acetaminophen (Tylenol), Serum: <10 ug/mL — ABNORMAL LOW (ref 10–30)

## 2020-04-29 LAB — SARS CORONAVIRUS 2 BY RT PCR (HOSPITAL ORDER, PERFORMED IN ~~LOC~~ HOSPITAL LAB): SARS Coronavirus 2: NEGATIVE

## 2020-04-29 LAB — CBC
HCT: 45.7 % (ref 36.0–46.0)
Hemoglobin: 15.5 g/dL — ABNORMAL HIGH (ref 12.0–15.0)
MCH: 29.7 pg (ref 26.0–34.0)
MCHC: 33.9 g/dL (ref 30.0–36.0)
MCV: 87.5 fL (ref 80.0–100.0)
Platelets: 303 10*3/uL (ref 150–400)
RBC: 5.22 MIL/uL — ABNORMAL HIGH (ref 3.87–5.11)
RDW: 13.1 % (ref 11.5–15.5)
WBC: 9.4 10*3/uL (ref 4.0–10.5)
nRBC: 0 % (ref 0.0–0.2)

## 2020-04-29 LAB — SALICYLATE LEVEL: Salicylate Lvl: <7 mg/dL — ABNORMAL LOW (ref 7.0–30.0)

## 2020-04-29 LAB — ETHANOL: Alcohol, Ethyl (B): <10 mg/dL (ref <10)

## 2020-04-29 MED ORDER — DOXEPIN HCL 10 MG PO CAPS
20.0000 mg | ORAL_CAPSULE | Freq: Every day | ORAL | Status: DC
  Administered 2020-04-29: 20 mg via ORAL
  Filled 2020-04-29 (×2): qty 2

## 2020-04-29 MED ORDER — ARIPIPRAZOLE 2 MG PO TABS
2.0000 mg | ORAL_TABLET | Freq: Every day | ORAL | Status: DC
  Administered 2020-04-29: 2 mg via ORAL
  Filled 2020-04-29 (×2): qty 1

## 2020-04-29 MED ORDER — CLONAZEPAM 0.5 MG PO TABS
0.5000 mg | ORAL_TABLET | Freq: Two times a day (BID) | ORAL | Status: DC
  Administered 2020-04-29 – 2020-04-30 (×2): 0.5 mg via ORAL
  Filled 2020-04-29 (×2): qty 1

## 2020-04-29 MED ORDER — OLANZAPINE 5 MG PO TBDP
10.0000 mg | ORAL_TABLET | Freq: Once | ORAL | Status: AC
  Administered 2020-04-29: 14:00:00 10 mg via ORAL
  Filled 2020-04-29: qty 2

## 2020-04-29 MED ORDER — BENZTROPINE MESYLATE 1 MG PO TABS
0.5000 mg | ORAL_TABLET | Freq: Two times a day (BID) | ORAL | Status: DC
  Administered 2020-04-29 – 2020-04-30 (×2): 0.5 mg via ORAL
  Filled 2020-04-29 (×2): qty 1

## 2020-04-29 NOTE — ED Notes (Signed)
Hourly rounding reveals patient asleep in room. No complaints, stable, in no acute distress. Q15 minute rounds and monitoring via Rover and Officer to continue.  

## 2020-04-29 NOTE — Consult Note (Signed)
[facility]   Reason for Consult:  Homero FellersFrank relapse of Schizoaffective issues   And Suicidal now on IVC    Referring Physician:   ED MD    Patient Identification: Frances Stanley MRN:  [id number] Principal Diagnosis: <principal problem not specified> Diagnosis:    Schizoaffective Disorder Generalized anxiety  Marital discord  Issues with relapse and non compliance   Total Time spent with patient:  Close   to  one hour   Subjective:   Frances DameCarri L Mis is a 59 y.o. female patient admitted with   Relapse of schizoaffective symptoms off meds --for several years no follow up  Or continuity     HPI:  Patient with late onset schizoaffective disorder   Past Psychiatric History:   She was hospitalized six years ago but then did not --follow up since then     Risk to Self:    High  Risk to Others:  none  Prior Inpatient Therapy:  see above  Prior Outpatient Therapy:   None recently  Past Medical History:  Past Medical History:  Diagnosis Date  . Clavicle fracture   . Depression   . Schizophrenia [facility](HCC)     Past Surgical History:  Procedure Laterality Date  . APPENDECTOMY    . BREAST ENHANCEMENT SURGERY Bilateral    and removal  . BREAST SURGERY    . CESAREAN SECTION    . CHOLECYSTECTOMY    . CLAVICLE SURGERY    . CLOSED REDUCTION FINGER WITH PERCUTANEOUS PINNING Left 09/16/2017   Procedure: CLOSED REDUCTION FINGER WITH PERCUTANEOUS PINNING;  Surgeon: Kennedy BuckerMenz, Michael, MD;  Location: [facility] ORS;  Service: Orthopedics;  Laterality: Left;   Family History: No family history on file. Family Psychiatric  History: \  She is not sure she says   Court and legal issues none Education --HS  Drugs substances --none ETOH none  Work ---currently not working stays at home      Social History:  Social History   Substance and Sexual Activity  Alcohol Use Yes     Social History   Substance and Sexual Activity  Drug Use No    Social History    Socioeconomic History  . Marital status: Married    Spouse name: Not on file  . Number of children: Not on file  . Years of education: Not on file  . Highest education level: Not on file  Occupational History  . Not on file  Tobacco Use  . Smoking status: Never Smoker  . Smokeless tobacco: Never Used  Vaping Use  . Vaping Use: Never used  Substance and Sexual Activity  . Alcohol use: Yes  . Drug use: No  . Sexual activity: Not on file  Other Topics Concern  . Not on file  Social History Narrative  . Not on file   Social Determinants of Health   Financial Resource Strain:   . Difficulty of Paying Living Expenses: Not on file  Food Insecurity:   . Worried About Programme researcher, broadcasting/film/videounning Out of Food in the Last Year: Not on file  . Ran Out of Food in the Last Year: Not on file  Transportation Needs:   . Lack of Transportation (Medical): Not on file  . Lack of Transportation (Non-Medical): Not on file  Physical Activity:   . Days of Exercise per Week: Not on file  . Minutes of Exercise per Session: Not on file  Stress:   . Feeling of Stress : Not on file  Social  Connections:   . Frequency of Communication with Friends and Family: Not on file  . Frequency of Social Gatherings with Friends and Family: Not on file  . Attends Religious Services: Not on file  . Active Member of Clubs or Organizations: Not on file  . Attends Banker Meetings: Not on file  . Marital Status: Not on file   Additional Social History:  Lives with husband of forty years, stressed by finances and marital discord ----no structure,  Kids grown      Allergies:   Allergies  Allergen Reactions  . Morphine And Related Rash    Labs:  Results for orders placed or performed during the hospital encounter of 04/29/20 (from the past 48 hour(s))  Comprehensive metabolic panel     Status: Abnormal   Collection Time: 04/29/20  1:01 PM  Result Value Ref Range   Sodium 140 135 - 145 mmol/L   Potassium 4.5  3.5 - 5.1 mmol/L   Chloride 102 98 - 111 mmol/L   CO2 26 22 - 32 mmol/L   Glucose, Bld 112 (H) 70 - 99 mg/dL    Comment: Glucose reference range applies only to samples taken after fasting for at least 8 hours.   BUN 12 8 - 23 mg/dL   Creatinine, Ser 2.77 0.44 - 1.00 mg/dL   Calcium 9.7 8.9 - 41.2 mg/dL   Total Protein 7.8 6.5 - 8.1 g/dL   Albumin 4.5 3.5 - 5.0 g/dL   AST 25 15 - 41 U/L   ALT 22 0 - 44 U/L   Alkaline Phosphatase 56 38 - 126 U/L   Total Bilirubin 0.7 0.3 - 1.2 mg/dL   GFR calc non Af Amer >60 >60 mL/min   GFR calc Af Amer >60 >60 mL/min   Anion gap 12 5 - 15    Comment: Performed at [facility], [address], [place], [place] [address]  Ethanol     Status: None   Collection Time: 04/29/20  1:01 PM  Result Value Ref Range   Alcohol, Ethyl (B) <10 <10 mg/dL    Comment: (NOTE) Lowest detectable limit for serum alcohol is 10 mg/dL.  For medical purposes only. Performed at [facility], [address] [place], [place] [address]   Salicylate level     Status: Abnormal   Collection Time: 04/29/20  1:01 PM  Result Value Ref Range   Salicylate Lvl <7.0 (L) 7.0 - 30.0 mg/dL    Comment: Performed at [facility], [address] [place], [place] [address]  Acetaminophen level     Status: Abnormal   Collection Time: 04/29/20  1:01 PM  Result Value Ref Range   Acetaminophen (Tylenol), Serum <10 (L) 10 - 30 ug/mL    Comment: (NOTE) Therapeutic concentrations vary significantly. A range of 10-30 ug/mL  may be an effective concentration for many patients. However, some  are best treated at concentrations outside of this range. Acetaminophen concentrations >150 ug/mL at 4 hours after ingestion  and >50 ug/mL at 12 hours after ingestion are often associated with  toxic reactions.  Performed at [facility] [address] [place], [place] [address]   cbc     Status: Abnormal   Collection Time: 04/29/20  1:01 PM  Result  Value Ref Range   WBC 9.4 4.0 - 10.5 K/uL   RBC 5.22 (H) 3.87 - 5.11 MIL/uL   Hemoglobin 15.5 (H) 12.0 - 15.0 g/dL   HCT 29.4 36 -  46 %   MCV 87.5 80.0 - 100.0 fL   MCH 29.7 26.0 - 34.0 pg   MCHC 33.9 30.0 - 36.0 g/dL   RDW 82.9 93.7 - 16.9 %   Platelets 303 150 - 400 K/uL   nRBC 0.0 0.0 - 0.2 %    Comment: Performed at [facility], [address], [place], [place] [address]  Urine Drug Screen, Qualitative     Status: Abnormal   Collection Time: 04/29/20  1:01 PM  Result Value Ref Range   Tricyclic, Ur Screen NONE DETECTED NONE DETECTED   Amphetamines, Ur Screen NONE DETECTED NONE DETECTED   MDMA (Ecstasy)Ur Screen NONE DETECTED NONE DETECTED   Cocaine Metabolite,Ur [place] NONE DETECTED NONE DETECTED   Opiate, Ur Screen NONE DETECTED NONE DETECTED   Phencyclidine (PCP) Ur S NONE DETECTED NONE DETECTED   Cannabinoid 50 Ng, Ur [place] POSITIVE (A) NONE DETECTED   Barbiturates, Ur Screen NONE DETECTED NONE DETECTED   Benzodiazepine, Ur Scrn NONE DETECTED NONE DETECTED   Methadone Scn, Ur NONE DETECTED NONE DETECTED    Comment: (NOTE) Tricyclics + metabolites, urine    Cutoff 1000 ng/mL Amphetamines + metabolites, urine  Cutoff 1000 ng/mL MDMA (Ecstasy), urine              Cutoff 500 ng/mL Cocaine Metabolite, urine          Cutoff 300 ng/mL Opiate + metabolites, urine        Cutoff 300 ng/mL Phencyclidine (PCP), urine         Cutoff 25 ng/mL Cannabinoid, urine                 Cutoff 50 ng/mL Barbiturates + metabolites, urine  Cutoff 200 ng/mL Benzodiazepine, urine              Cutoff 200 ng/mL Methadone, urine                   Cutoff 300 ng/mL  The urine drug screen provides only a preliminary, unconfirmed analytical test result and should not be used for non-medical purposes. Clinical consideration and professional judgment should be applied to any positive drug screen result due to possible interfering substances. A more specific alternate chemical method must be used in  order to obtain a confirmed analytical result. Gas chromatography / mass spectrometry (GC/MS) is the preferred confirm atory method. Performed at [facility], [address] [place], [place] [address]     No current facility-administered medications for this encounter.   Current Outpatient Medications  Medication Sig Dispense Refill  . acetaminophen (TYLENOL) 500 MG tablet Take 500 mg by mouth every 8 (eight) hours as needed for mild pain or headache.    . albuterol (VENTOLIN HFA) 108 (90 Base) MCG/ACT inhaler Inhale 2 puffs into the lungs every 4 (four) hours as needed for wheezing or shortness of breath. 18 g 0  . bismuth subsalicylate (PEPTO BISMOL) 262 MG/15ML suspension Take 30 mLs by mouth 3 (three) times daily as needed for indigestion.    . carboxymethylcellulose (REFRESH PLUS) 0.5 % SOLN Place 1 drop into both eyes 3 (three) times daily as needed (dry eyes).    . chlorpheniramine-HYDROcodone (TUSSIONEX PENNKINETIC ER) 10-8 MG/5ML SUER Take 5 mLs by mouth every 12 (twelve) hours as needed for cough. Will cause drowsiness; NO DRIVING. 70 mL 0  . predniSONE (DELTASONE) 20 MG tablet Take 2 tablets (40 mg total) by mouth daily. 10 tablet 0    Musculoskeletal: Strength &  Muscle Tone: normal  Gait & Station: normal  Patient leans: n/a   Psychiatric Specialty Exam: Physical Exam  Review of Systems  Blood pressure (!) 226/85, pulse 69, temperature 98.6 F (37 C), temperature source Oral, resp. rate 20, height 5\' 7"  (1.702 m), weight 83.9 kg, SpO2 100 %.Body mass index is 28.98 kg/m.   Mental Status   Appearance --Alert but distraught and dysphoric  Looks haggard and unkept Rapport and eye contact normal Mood and affect depressed blunted and anxious Consciousness not clouded or fluctuant Concentration and attention fair  Thought process and content --hears voices is suspicious paranoid fearful ----at times illogical in thought Speech normal rate tone volume  fluency Movements --no shakes tics tremors Memory remote recent immediate intact through general questions SI ---tried to hang Self today --- HI none Judgement insight poor Reliability fair  Abstraction okay   Intelligence and fund of knowledge average to below average                                                             Language english Recall fair Akathisia none Aims not needed  Assets --stable home  ADL's impaired when stressed and psychotic  Handedness not known Cognition---fair  Sleep erratic Psych motor activity normal      Treatment Plan Summary:   Patient originally voluntary now involuntary due to issues of active SI and attempt   Needs inpatient treatment to help stabilize  Meds started in ER pending transfer to help with psychosis and mood problems       Disposition:   Awaits direct admission here or to be referred medications started to help with above symptoms   , MD 04/29/2020 5:12 PM

## 2020-04-29 NOTE — ED Triage Notes (Signed)
Pt is crying in triage, states she has a hx of schizophrenia and has not had to take medications in a long time, states she has been under extra stress lately and is hearing like "static noise" and feeling like bugs are crawling under her skin, states she attempted to hand herself this morning but the rope slipped off.

## 2020-04-29 NOTE — BH Assessment (Signed)
Assessment Note  Frances Stanley is an 59 y.o. female who presents to [facility] ED involuntarily for treatment. Per triage note, Pt is crying in triage, states she has a hx of schizophrenia and has not had to take medications in a long time, states she has been under extra stress lately and is hearing like "static noise" and feeling like bugs are crawling under her skin, states she attempted to hand herself this morning but the rope slipped off.  During TTS assessment pt presents alert, depressed, sad and oriented x 3, restless but cooperative, and mood-congruent with affect. The pt does not appear to be responding to internal or external stimuli. Pt presents with some delusional thinking around bugs crawling under her skin. Pt verified the information provided to triage RN. Pt identified her main complaint to be depression, SI and need for medications. Pt reports and increase in her depression symptoms in the last few days and identified marital problems and increased stress as her triggers. Pt reports to live with her spouse and to be a retired Charity fundraiser. Pt reports to currently endorse AH (background noises) without commands. Pt tearful reports an attempt to hang herself today but was stopped by her husband. Pt continues to endorse SI with no current plan. Pt denies a hx of SI or attempts and identified increased stressors as the main trigger to her current SI. Pt reports MH hx of late onset  schizophrenia and to currently smoke marijuana most days to help her sleep. Pt reports an INPT and OPT hx with [facility] 3 years ago and noncompliance with medications since then. Pt reports a family hx of Bipolar (mom) but denies a family hx of SA. Pt reports struggles sleeping (none in two weeks) and eating (no appetite) Pt denies any current HI/VH and is currently unable to contract for safety.   Per Dr. Smith Robert pt is meets criteria for INPT  Diagnosis: MDD  Past Medical History:  Past Medical History:  Diagnosis Date    Clavicle fracture    Depression    Schizophrenia [facility])     Past Surgical History:  Procedure Laterality Date   APPENDECTOMY     BREAST ENHANCEMENT SURGERY Bilateral    and removal   BREAST SURGERY     CESAREAN SECTION     CHOLECYSTECTOMY     CLAVICLE SURGERY     CLOSED REDUCTION FINGER WITH PERCUTANEOUS PINNING Left 09/16/2017   Procedure: CLOSED REDUCTION FINGER WITH PERCUTANEOUS PINNING;  Surgeon: Kennedy Bucker, MD;  Location: [facility] ORS;  Service: Orthopedics;  Laterality: Left;    Family History: No family history on file.  Social History:  reports that she has never smoked. She has never used smokeless tobacco. She reports current alcohol use. She reports that she does not use drugs.  Additional Social History:  Alcohol / Drug Use Pain Medications: see mar Prescriptions: see mar Over the Counter: see mar History of alcohol / drug use?: Yes Substance #1 Name of Substance 1: Marijuana  CIWA: CIWA-Ar BP: (!) 226/85 Pulse Rate: 69 COWS:    Allergies:  Allergies  Allergen Reactions   Morphine And Related Rash    Home Medications: (Not in a hospital admission)   OB/GYN Status:  No LMP recorded. Patient is postmenopausal.  General Assessment Data Location of Assessment: [facility] ED TTS Assessment: In system Is this a Tele or Face-to-Face Assessment?: Face-to-Face Is this an Initial Assessment or a Re-assessment for this encounter?: Initial Assessment Patient Accompanied by:: N/A Language Other than English:  No Living Arrangements: Other (Comment) What gender do you identify as?: Female Date Telepsych consult ordered in [facility]: 04/29/20 Time Telepsych consult ordered in [facility]: 1153 Marital status: Married Jordan name: unknown  Pregnancy Status: No Living Arrangements: Spouse/significant other Can pt return to current living arrangement?: Yes Admission Status: Involuntary Petitioner: Other Is patient capable of signing voluntary admission?: No Referral  Source: Self/Family/Friend Insurance type: Medicare      Crisis Care Plan Living Arrangements: Spouse/significant other Legal Guardian:  (self) Name of Psychiatrist: None Name of Therapist: None      Risk to self with the past 6 months Suicidal Ideation: Yes-Currently Present Has patient been a risk to self within the past 6 months prior to admission? : No Suicidal Intent: Yes-Currently Present Has patient had any suicidal intent within the past 6 months prior to admission? : No Is patient at risk for suicide?: Yes Suicidal Plan?: Yes-Currently Present Has patient had any suicidal plan within the past 6 months prior to admission? : No Specify Current Suicidal Plan: hang herself  Access to Means: No What has been your use of drugs/alcohol within the last 12 months?: marijuana  Previous Attempts/Gestures: No How many times?: 0 Other Self Harm Risks: None reported  Triggers for Past Attempts: None known Intentional Self Injurious Behavior: None Family Suicide History: No Recent stressful life event(s):  (Marital issues & stress) Persecutory voices/beliefs?: Yes Depression: Yes Depression Symptoms: Insomnia, Tearfulness, Feeling worthless/self pity Substance abuse history and/or treatment for substance abuse?: No Suicide prevention information given to non-admitted patients: Not applicable  Risk to Others within the past 6 months Homicidal Ideation: No Does patient have any lifetime risk of violence toward others beyond the six months prior to admission? : No Thoughts of Harm to Others: No Current Homicidal Intent: No Current Homicidal Plan: No Access to Homicidal Means: No Identified Victim: n/a History of harm to others?: No Assessment of Violence: None Noted Violent Behavior Description: n/a Does patient have access to weapons?: No Criminal Charges Pending?: No Does patient have a court date: No Is patient on probation?: No  Psychosis Hallucinations:  Auditory Delusions: None noted  Mental Status Report Appearance/Hygiene: In scrubs Eye Contact: Good Motor Activity: Freedom of movement Speech: Logical/coherent Level of Consciousness: Alert, Crying Mood: Depressed, Labile, Sad Affect: Depressed, Sad, Labile Anxiety Level: None Thought Processes: Coherent, Relevant Judgement: Partial Orientation: Person, Place, Situation, Time Obsessive Compulsive Thoughts/Behaviors: None  Cognitive Functioning Concentration: Normal Memory: Recent Intact, Remote Intact Is patient IDD: No Insight: Poor Impulse Control: Fair Appetite: Poor Have you had any weight changes? : No Change Sleep: Decreased Total Hours of Sleep:  (less than 5 ) Vegetative Symptoms: None  ADLScreening [facility]) Patient's cognitive ability adequate to safely complete daily activities?: Yes Patient able to express need for assistance with ADLs?: Yes Independently performs ADLs?: Yes (appropriate for developmental age)  Prior Inpatient Therapy Prior Inpatient Therapy: Yes Prior Therapy Dates: 2 years ago Prior Therapy Facilty/Provider(s): [facility] Reason for Treatment: Depression  Prior Outpatient Therapy Prior Outpatient Therapy: Yes Prior Therapy Dates: 2 years ago Prior Therapy Facilty/Provider(s): [facility] Reason for Treatment: Depression  Does patient have an ACCT team?: No Does patient have Intensive In-House Services?  : No Does patient have [facility] services? : Unknown Does patient have P4CC services?: Unknown  ADL Screening (condition at time of admission) Patient's cognitive ability adequate to safely complete daily activities?: Yes Is the patient deaf or have difficulty hearing?: No Does the patient have difficulty seeing, even when wearing  glasses/contacts?: No Does the patient have difficulty concentrating, remembering, or making decisions?: No Patient able to express need for assistance with ADLs?: Yes Does the patient have difficulty  dressing or bathing?: No Independently performs ADLs?: Yes (appropriate for developmental age) Does the patient have difficulty walking or climbing stairs?: No Weakness of Legs: None Weakness of Arms/Hands: None  Home Assistive Devices/Equipment Home Assistive Devices/Equipment: None  Therapy Consults (therapy consults require a physician order) PT Evaluation Needed: No OT Evalulation Needed: No SLP Evaluation Needed: No Abuse/Neglect Assessment (Assessment to be complete while patient is alone) Abuse/Neglect Assessment Can Be Completed: Yes Physical Abuse: Denies Verbal Abuse: Denies Sexual Abuse: Denies Exploitation of patient/patient's resources: Denies Self-Neglect: Denies Values / Beliefs Cultural Requests During Hospitalization: None Spiritual Requests During Hospitalization: None Consults Spiritual Care Consult Needed: No Transition of Care Team Consult Needed: No Advance Directives (For Healthcare) Does Patient Have a Medical Advance Directive?: No Would patient like information on creating a medical advance directive?: No - Patient declined          Disposition:  Disposition Initial Assessment Completed for this Encounter: Yes Patient referred to: Other (Comment)  On Site Evaluation by:   Reviewed with Physician:    Opal Sidles 04/29/2020 5:32 PM

## 2020-04-29 NOTE — ED Provider Notes (Addendum)
[facility] Emergency Department Provider Note   ____________________________________________   First MD Initiated Contact with Patient 04/29/20 1314     (approximate)  I have reviewed the triage vital signs and the nursing notes.   HISTORY  Chief Complaint Psychiatric Evaluation and Suicidal    HPI Frances Stanley is a 59 y.o. female with a stated past medical history of schizophrenia who presents requesting psychiatric evaluation for suicidal ideation and auditory hallucinations.  Patient states that she has been off medications for "years" after she "lost her psychiatrist".  Patient states that over the last 3 months she has had worsening suicidal thoughts without a definitive plan.  Patient states that 2 weeks ago she began having worsening auditory hallucinations of "radio static".  Patient denies any exacerbating or relieving factors.  Patient endorses occasional marijuana abuse as she states this decreases her anxiety.         Past Medical History:  Diagnosis Date  . Clavicle fracture   . Depression   . Schizophrenia (HCC)     There are no problems to display for this patient.   Past Surgical History:  Procedure Laterality Date  . APPENDECTOMY    . BREAST ENHANCEMENT SURGERY Bilateral    and removal  . BREAST SURGERY    . CESAREAN SECTION    . CHOLECYSTECTOMY    . CLAVICLE SURGERY    . CLOSED REDUCTION FINGER WITH PERCUTANEOUS PINNING Left 09/16/2017   Procedure: CLOSED REDUCTION FINGER WITH PERCUTANEOUS PINNING;  Surgeon: Kennedy Bucker, MD;  Location: [facility] ORS;  Service: Orthopedics;  Laterality: Left;    Prior to Admission medications   Medication Sig Start Date End Date Taking? Authorizing Provider  acetaminophen (TYLENOL) 500 MG tablet Take 500 mg by mouth every 8 (eight) hours as needed for mild pain or headache.    [provider]  albuterol (VENTOLIN HFA) 108 (90 Base) MCG/ACT inhaler Inhale 2 puffs into the lungs every  4 (four) hours as needed for wheezing or shortness of breath. 09/21/19   Verlee Monte, NP  bismuth subsalicylate (PEPTO BISMOL) 262 MG/15ML suspension Take 30 mLs by mouth 3 (three) times daily as needed for indigestion.    [provider]  carboxymethylcellulose (REFRESH PLUS) 0.5 % SOLN Place 1 drop into both eyes 3 (three) times daily as needed (dry eyes).    [provider]  chlorpheniramine-HYDROcodone (TUSSIONEX PENNKINETIC ER) 10-8 MG/5ML SUER Take 5 mLs by mouth every 12 (twelve) hours as needed for cough. Will cause drowsiness; NO DRIVING. 03/20/68   Verlee Monte, NP  predniSONE (DELTASONE) 20 MG tablet Take 2 tablets (40 mg total) by mouth daily. 09/21/19   Verlee Monte, NP  Fluticasone Propionate, Inhal, (FLOVENT DISKUS) 250 MCG/BLIST AEPB Inhale 1 puff into the lungs daily.  09/21/19  [provider]  venlafaxine (EFFEXOR) 75 MG tablet Take 75 mg by mouth daily.   09/21/19  [provider]    Allergies Morphine and related  No family history on file.  Social History Social History   Tobacco Use  . Smoking status: Never Smoker  . Smokeless tobacco: Never Used  Vaping Use  . Vaping Use: Never used  Substance Use Topics  . Alcohol use: Yes  . Drug use: No    Review of Systems Constitutional: No fever/chills Eyes: No visual changes. ENT: No sore throat. Cardiovascular: Denies chest pain. Respiratory: Denies shortness of breath. Gastrointestinal: No abdominal pain.  No nausea, no vomiting.  No diarrhea.  Genitourinary: Negative for dysuria. Musculoskeletal: Negative for acute arthralgias Skin: Negative for rash. Neurological: Negative for headaches, weakness/numbness/paresthesias in any extremity Psychiatric:  Endorses suicidal ideation, auditory hallucinations.  Patient denies any visual hallucinations or homicidal ideation   ____________________________________________   PHYSICAL EXAM:  VITAL SIGNS: ED Triage Vitals  Enc Vitals  Group     BP 04/29/20 1250 (!) 226/85     Pulse Rate 04/29/20 1250 69     Resp 04/29/20 1250 20     Temp 04/29/20 1250 98.6 F (37 C)     Temp Source 04/29/20 1250 Oral     SpO2 04/29/20 1250 100 %     Weight 04/29/20 1250 185 lb (83.9 kg)     Height 04/29/20 1250 5\' 7"  (1.702 m)     Head Circumference --      Peak Flow --      Pain Score 04/29/20 1259 0     Pain Loc --      Pain Edu? --      Excl. in GC? --    Constitutional: Alert and oriented. Well appearing and in no acute distress. Eyes: Conjunctivae are normal. PERRL. EOMI. Head: Atraumatic. Nose: No congestion/rhinnorhea. Mouth/Throat: Mucous membranes are moist. Neck: No stridor Cardiovascular: Normal rate, regular rhythm. Grossly normal heart sounds.  Good peripheral circulation. Respiratory: Normal respiratory effort.  No retractions. Gastrointestinal: Soft and nontender. No distention. Musculoskeletal: No lower extremity tenderness nor edema.  No joint effusions. Neurologic:  Normal speech and language. No gross focal neurologic deficits are appreciated. Skin:  Skin is warm and dry. No rash noted. Psychiatric: Depressed mood and affect.  Tearful.  Speech soft and behavior is withdrawn  ____________________________________________   LABS (all labs ordered are listed, but only abnormal results are displayed)  Labs Reviewed  COMPREHENSIVE METABOLIC PANEL - Abnormal; Notable for the following components:      Result Value   Glucose, Bld 112 (*)    All other components within normal limits  SALICYLATE LEVEL - Abnormal; Notable for the following components:   Salicylate Lvl <7.0 (*)    All other components within normal limits  ACETAMINOPHEN LEVEL - Abnormal; Notable for the following components:   Acetaminophen (Tylenol), Serum <10 (*)    All other components within normal limits  CBC - Abnormal; Notable for the following components:   RBC 5.22 (*)    Hemoglobin 15.5 (*)    All other components within normal  limits  URINE DRUG SCREEN, QUALITATIVE ([facility] ONLY) - Abnormal; Notable for the following components:   Cannabinoid 50 Ng, Ur [place] POSITIVE (*)    All other components within normal limits  SARS CORONAVIRUS 2 BY RT PCR (HOSPITAL ORDER, PERFORMED IN [facility])  ETHANOL    PROCEDURES  Procedure(s) performed (including Critical Care):  Procedures   ____________________________________________   INITIAL IMPRESSION / ASSESSMENT AND PLAN / ED COURSE      Thoughts are linear and organized, and patient has no VH or HI. Prior suicide attempt: Denies Prior Psychiatric Hospitalizations: Multiple  Clinically patient displays no overt toxidrome; they are well appearing, with low suspicion for toxic ingestion given history and exam. Thoughts unlikely 2/2 anemia, hypothyroidism, infection, or ICH.  Consult: Psychiatry to evaluate patient for potential hold for danger to self. Disposition: Plan admit to psychiatry for further management of symptoms.       ____________________________________________   FINAL CLINICAL IMPRESSION(S) / ED DIAGNOSES  Final diagnoses:  Suicidal ideation  Psychosis, unspecified psychosis type (HCC)  Depression, unspecified depression type     ED Discharge Orders    None       Note:  This document was prepared using Dragon voice recognition software and may include unintentional dictation errors.   Merwyn Katos, MD 04/29/20 1521    Merwyn Katos, MD 05/19/20 [phone]

## 2020-04-29 NOTE — ED Notes (Signed)
Report to include Situation, Background, Assessment, and Recommendations received from Amy RN. Patient alert and oriented, warm and dry, in no acute distress. Patient denies SI, HI, AVH and pain. Patient made aware of Q15 minute rounds and Rover and Officer presence for their safety. Patient instructed to come to me with needs or concerns.   

## 2020-04-29 NOTE — ED Notes (Signed)
Pt moved to BHU at this time. Reports given to Selena Batten, Charity fundraiser

## 2020-04-29 NOTE — ED Notes (Signed)
Pt dressed out into burgundy scrubs with this tech and Melanie,EDT in the rm. Pt belongings consist of brown/black shoes, black socks, red shirt, brown shorts, a necklace with a cross charm, a bracelet with beads, a brown purse, a black bra and panties.

## 2020-04-30 DIAGNOSIS — F329 Major depressive disorder, single episode, unspecified: Secondary | ICD-10-CM | POA: Diagnosis not present

## 2020-04-30 NOTE — ED Notes (Signed)
Pt discharged to [facility] under IVC.  VS stable. Belongings sent with officer.

## 2020-04-30 NOTE — ED Notes (Signed)
RN attempted to call report to [facility]. No answer.

## 2020-04-30 NOTE — ED Provider Notes (Signed)
Emergency Medicine Observation Re-evaluation Note  Frances Stanley is a 59 y.o. female, seen on rounds today.  Pt initially presented to the ED for complaints of Psychiatric Evaluation and Suicidal Currently, the patient is resting comfortably.  Physical Exam  BP (!) 147/69 (BP Location: Left Arm)    Pulse (!) 51    Temp 97.9 F (36.6 C) (Oral)    Resp 18    Ht 5\' 7"  (1.702 m)    Wt 83.9 kg    SpO2 95%    BMI 28.98 kg/m  Physical Exam Constitutional:      Appearance: She is not ill-appearing or toxic-appearing.  Eyes:     Extraocular Movements: Extraocular movements intact.     Pupils: Pupils are equal, round, and reactive to light.  Cardiovascular:     Rate and Rhythm: Normal rate.  Pulmonary:     Effort: Pulmonary effort is normal.  Abdominal:     General: There is no distension.  Skin:    General: Skin is warm and dry.  Neurological:     General: No focal deficit present.     Cranial Nerves: No cranial nerve deficit.      ED Course / MDM  EKG:    I have reviewed the labs performed to date as well as medications administered while in observation.  Recent changes in the last 24 hours include continued bed search.  Plan  Current plan is for inpatient psychiatric care. Patient is under full IVC at this time.   , MD 04/30/20 [phone]

## 2020-04-30 NOTE — BH Assessment (Signed)
PATIENT BED AVAILABLE AFTER 9AM ON 04/30/20  Patient has been accepted to [facility].  Patient assigned to [facility] Accepting physician is Dr. Estill Cotta.  Call report to [phone].  Representative was [employer]   ER Staff is aware of it:  [facility] ER Secretary  Dr. Don Perking, ER MD  Selena Batten Patient's Nurse     Address: [address] [place], [place] [address]

## 2020-04-30 NOTE — BH Assessment (Addendum)
Referral information for Psychiatric Hospitalization faxed to;   [facility] [place] [phone]),   . [facility] ([phone]),  . [facility] [phone]),   . [facility] [phone]), Currently under review with Lyla Son

## 2020-05-19 ENCOUNTER — Emergency Department
Admission: EM | Admit: 2020-05-19 | Discharge: 2020-05-19 | Discharge status: Against Medical Advice | Payer: Medicare Other

## 2020-05-19 NOTE — ED Triage Notes (Signed)
First Nurse: patient brought in by ems from home. Patient with complaint of chest pain times two hours. Patient took 324 mg of asa at home. Patient was given 4 mg po Zofran by ems.

## 2021-01-29 ENCOUNTER — Other Ambulatory Visit: Payer: Self-pay

## 2021-01-29 ENCOUNTER — Encounter: Payer: Self-pay | Admitting: Emergency Medicine

## 2021-01-29 ENCOUNTER — Ambulatory Visit
Admission: EM | Admit: 2021-01-29 | Discharge: 2021-01-29 | Disposition: A | Discharge status: Home / Self Care | Payer: Medicare Other | Attending: Family Medicine | Admitting: Family Medicine

## 2021-01-29 DIAGNOSIS — Z7952 Long term (current) use of systemic steroids: Secondary | ICD-10-CM | POA: Insufficient documentation

## 2021-01-29 DIAGNOSIS — R197 Diarrhea, unspecified: Secondary | ICD-10-CM

## 2021-01-29 DIAGNOSIS — R112 Nausea with vomiting, unspecified: Secondary | ICD-10-CM

## 2021-01-29 DIAGNOSIS — B349 Viral infection, unspecified: Secondary | ICD-10-CM

## 2021-01-29 DIAGNOSIS — R059 Cough, unspecified: Secondary | ICD-10-CM

## 2021-01-29 DIAGNOSIS — Z79899 Other long term (current) drug therapy: Secondary | ICD-10-CM | POA: Diagnosis not present

## 2021-01-29 DIAGNOSIS — U071 COVID-19: Secondary | ICD-10-CM | POA: Diagnosis not present

## 2021-01-29 DIAGNOSIS — Z7951 Long term (current) use of inhaled steroids: Secondary | ICD-10-CM | POA: Insufficient documentation

## 2021-01-29 MED ORDER — ONDANSETRON HCL 4 MG PO TABS: 4.0000 mg | ORAL_TABLET | Freq: Four times a day (QID) | ORAL | 0 refills | Status: DC

## 2021-01-29 NOTE — Discharge Instructions (Addendum)
VIRAL ILLNESS: Your symptoms are consistent with a viral illness, COVID test to be back tomorrow.  Someone will contact you if positive and discuss treatment options.  At this time, increase rest and fluids.  Have sent Zofran to the pharmacy for your nausea and vomiting.  Eat bland meals.  Continue with the decongestants but if blood pressure is staying high, switch to Coricidin HBP.  You have received COVID testing today either for positive exposure, concerning symptoms that could be related to COVID infection, screening purposes, or re-testing after confirmed positive.  Your test obtained today checks for active viral infection in the last 1-2 weeks. If your test is negative now, you can still test positive later. So, if you do develop symptoms you should either get re-tested and/or isolate x 5 days and then strict mask use x 5 days (unvaccinated) or mask use x 10 days (vaccinated). Please follow CDC guidelines.  While Rapid antigen tests come back in 15-20 minutes, send out PCR/molecular test results typically come back within 1-3 days. In the mean time, if you are symptomatic, assume this could be a positive test and treat/monitor yourself as if you do have COVID.   We will call with test results if positive. Please download the MyChart app and set up a profile to access test results.   If symptomatic, go home and rest. Push fluids. Take Tylenol as needed for discomfort. Gargle warm salt water. Throat lozenges. Take Mucinex DM or Robitussin for cough. Humidifier in bedroom to ease coughing. Warm showers. Also review the COVID handout for more information.  COVID-19 INFECTION: The incubation period of COVID-19 is approximately 14 days after exposure, with most symptoms developing in roughly 4-5 days. Symptoms may range in severity from mild to critically severe. Roughly 80% of those infected will have mild symptoms. People of any age may become infected with COVID-19 and have the ability to transmit  the virus. The most common symptoms include: fever, fatigue, cough, body aches, headaches, sore throat, nasal congestion, shortness of breath, nausea, vomiting, diarrhea, changes in smell and/or taste.    COURSE OF ILLNESS Some patients may begin with mild disease which can progress quickly into critical symptoms. If your symptoms are worsening please call ahead to the Emergency Department and proceed there for further treatment. Recovery time appears to be roughly 1-2 weeks for mild symptoms and 3-6 weeks for severe disease.   GO IMMEDIATELY TO ER FOR FEVER YOU ARE UNABLE TO GET DOWN WITH TYLENOL, BREATHING PROBLEMS, CHEST PAIN, FATIGUE, LETHARGY, INABILITY TO EAT OR DRINK, ETC  QUARANTINE AND ISOLATION: To help decrease the spread of COVID-19 please remain isolated if you have COVID infection or are highly suspected to have COVID infection. This means -stay home and isolate to one room in the home if you live with others. Do not share a bed or bathroom with others while ill, sanitize and wipe down all countertops and keep common areas clean and disinfected. Stay home for 5 days. If you have no symptoms or your symptoms are resolving after 5 days, you can leave your house. Continue to wear a mask around others for 5 additional days. If you have been in close contact (within 6 feet) of someone diagnosed with COVID 19, you are advised to quarantine in your home for 14 days as symptoms can develop anywhere from 2-14 days after exposure to the virus. If you develop symptoms, you  must isolate.  Most current guidelines for COVID after exposure -unvaccinated: isolate 5  days and strict mask use x 5 days. Test on day 5 is possible -vaccinated: wear mask x 10 days if symptoms do not develop -You do not necessarily need to be tested for COVID if you have + exposure and  develop symptoms. Just isolate at home x10 days from symptom onset During this global pandemic, CDC advises to practice social distancing, try  to stay at least 71ft away from others at all times. Wear a face covering. Wash and sanitize your hands regularly and avoid going anywhere that is not necessary.  KEEP IN MIND THAT THE COVID TEST IS NOT 100% ACCURATE AND YOU SHOULD STILL DO EVERYTHING TO PREVENT POTENTIAL SPREAD OF VIRUS TO OTHERS (WEAR MASK, WEAR GLOVES, WASH HANDS AND SANITIZE REGULARLY). IF INITIAL TEST IS NEGATIVE, THIS MAY NOT MEAN YOU ARE DEFINITELY NEGATIVE. MOST ACCURATE TESTING IS DONE 5-7 DAYS AFTER EXPOSURE.   It is not advised by CDC to get re-tested after receiving a positive COVID test since you can still test positive for weeks to months after you have already cleared the virus.   *If you have not been vaccinated for COVID, I strongly suggest you consider getting vaccinated as long as there are no contraindications.

## 2021-01-29 NOTE — ED Provider Notes (Signed)
[facility]    CSN: [id number] Arrival date & time: 01/29/21  0919      History   Chief Complaint Chief Complaint  Patient presents with   Emesis   Nasal Congestion   Fever    HPI Frances Stanley is a 60 y.o. female presenting for approximately 4-day history of low-grade fever up to 100.5 degrees, fatigue, cough, congestion, nausea and vomiting as well as diarrhea.  Patient says she has had 3 episodes of vomiting.  Admits to sick contacts but denies any known COVID exposure.  Patient states she recently went to a funeral and also went to the movie theater so she could have been exposed to something.  Vaccinated for COVID-19 x3.  She says she has been taking over-the-counter Pepto-Bismol and Mucinex.  Patient says that she would like something different to help with her nausea and vomiting.  She denies any breathing difficulty.  She has no other complaints.  HPI  Past Medical History:  Diagnosis Date   Clavicle fracture    Depression    Schizophrenia (HCC)     There are no problems to display for this patient.   Past Surgical History:  Procedure Laterality Date   APPENDECTOMY     BREAST ENHANCEMENT SURGERY Bilateral    and removal   BREAST SURGERY     CESAREAN SECTION     CHOLECYSTECTOMY     CLAVICLE SURGERY     CLOSED REDUCTION FINGER WITH PERCUTANEOUS PINNING Left 09/16/2017   Procedure: CLOSED REDUCTION FINGER WITH PERCUTANEOUS PINNING;  Surgeon: Kennedy Bucker, MD;  Location: [facility] OR[facility];  Service: Orthopedics;  Laterality: Left;    OB History   No obstetric history on file.      Home Medications    Prior to Admission medications   Medication Sig Start Date End Date Taking? Authorizing Provider  atorvastatin (LIPITOR) 10 MG tablet Take 1 tablet by mouth daily. 08/05/20  Yes [provider]  bismuth subsalicylate (PEPTO BISMOL) 262 MG/15ML suspension Take 30 mLs by mouth 3 (three) times daily as needed for indigestion.   Yes [provider]  lithium carbonate (ESKALITH) 450 MG CR tablet  07/10/20  Yes [provider]  acetaminophen (TYLENOL) 500 MG tablet Take 500 mg by mouth every 8 (eight) hours as needed for mild pain or headache.    [provider]  albuterol (VENTOLIN HFA) 108 (90 Base) MCG/ACT inhaler Inhale 2 puffs into the lungs every 4 (four) hours as needed for wheezing or shortness of breath. 09/21/19   Verlee Monte, NP  benztropine (COGENTIN) 0.5 MG tablet Take 0.5 mg by mouth 2 (two) times daily. 12/24/20   [provider]  carboxymethylcellulose (REFRESH PLUS) 0.5 % SOLN Place 1 drop into both eyes 3 (three) times daily as needed (dry eyes).    [provider]  chlorpheniramine-HYDROcodone (TUSSIONEX PENNKINETIC ER) 10-8 MG/5ML SUER Take 5 mLs by mouth every 12 (twelve) hours as needed for cough. Will cause drowsiness; NO DRIVING. 4/0/98   Verlee Monte, NP  predniSONE (DELTASONE) 20 MG tablet Take 2 tablets (40 mg total) by mouth daily. 09/21/19   Verlee Monte, NP  VRAYLAR 1.5 MG capsule Take 1.5 mg by mouth daily. 12/24/20   [provider]  Fluticasone Propionate, Inhal, (FLOVENT DISKUS) 250 MCG/BLIST AEPB Inhale 1 puff into the lungs daily.  09/21/19  [provider]  venlafaxine (EFFEXOR) 75 MG tablet Take 75 mg by mouth daily.   09/21/19  [provider]    Family History No family history on file.  Social History Social History   Tobacco Use   Smoking status: Never   Smokeless tobacco: Never  Vaping Use   Vaping Use: Never used  Substance Use Topics   Alcohol use: Yes   Drug use: No     Allergies   Morphine and related   Review of Systems Review of Systems  Constitutional:  Positive for fatigue and fever. Negative for chills and diaphoresis.  HENT:  Positive for congestion. Negative for ear pain, rhinorrhea, sinus pressure, sinus pain and sore throat.   Respiratory:  Positive for cough. Negative for shortness of breath.    Gastrointestinal:  Positive for diarrhea, nausea and vomiting. Negative for abdominal pain.  Musculoskeletal:  Negative for arthralgias and myalgias.  Skin:  Negative for rash.  Neurological:  Negative for weakness and headaches.  Hematological:  Negative for adenopathy.    Physical Exam Triage Vital Signs ED Triage Vitals  Enc Vitals Group     BP 01/29/21 1027 (!) 181/75     Pulse Rate 01/29/21 1027 68     Resp 01/29/21 1027 20     Temp 01/29/21 1027 98.3 F (36.8 C)     Temp Source 01/29/21 1027 Oral     SpO2 01/29/21 1027 100 %     Weight 01/29/21 1024 184 lb 15.5 oz (83.9 kg)     Height 01/29/21 1024 5\' 7"  (1.702 m)     Head Circumference --      Peak Flow --      Pain Score 01/29/21 1023 3     Pain Loc --      Pain Edu? --      Excl. in GC? --    No data found.  Updated Vital Signs BP (!) 157/69 (BP Location: Right Arm)   Pulse 68   Temp 98.3 F (36.8 C) (Oral)   Resp 20   Ht 5\' 7"  (1.702 m)   Wt 184 lb 15.5 oz (83.9 kg)   SpO2 100%   BMI 28.97 kg/m       Physical Exam Vitals and nursing note reviewed.  Constitutional:      General: She is not in acute distress.    Appearance: Normal appearance. She is not ill-appearing or toxic-appearing.  HENT:     Head: Normocephalic and atraumatic.     Nose: Congestion and rhinorrhea present.     Mouth/Throat:     Mouth: Mucous membranes are moist.     Pharynx: Oropharynx is clear. Posterior oropharyngeal erythema present.  Eyes:     General: No scleral icterus.       Right eye: No discharge.        Left eye: No discharge.     Conjunctiva/sclera: Conjunctivae normal.  Cardiovascular:     Rate and Rhythm: Normal rate and regular rhythm.     Heart sounds: Normal heart sounds.  Pulmonary:     Effort: Pulmonary effort is normal. No respiratory distress.     Breath sounds: Normal breath sounds. No wheezing, rhonchi or rales.  Abdominal:     Tenderness: There is abdominal tenderness (generalized).   Musculoskeletal:     Cervical back: Neck supple.  Skin:    General: Skin is dry.  Neurological:     General: No focal deficit present.     Mental Status: She is alert. Mental status is at baseline.     Motor: No weakness.  Gait: Gait normal.  Psychiatric:        Mood and Affect: Mood normal.        Behavior: Behavior normal.        Thought Content: Thought content normal.     UC Treatments / Results  Labs (all labs ordered are listed, but only abnormal results are displayed) Labs Reviewed  SARS CORONAVIRUS 2 (TAT 6-24 HRS)    EKG   Radiology No results found.  Procedures Procedures (including critical care time)  Medications Ordered in UC Medications - No data to display  Initial Impression / Assessment and Plan / UC Course  I have reviewed the triage vital signs and the nursing notes.  Pertinent labs & imaging results that were available during my care of the patient were reviewed by me and considered in my medical decision making (see chart for details).  60 year old female presenting for approximately 4-day history of cough, congestion, nausea/vomiting and diarrhea.  Also has fatigue.  Vital signs are stable today but her blood pressure is elevated.  Initial BP is 181/75 and repeat is one 157/69.  Patient says she does take her blood pressure medication and has been taking it since she has been ill.  Exam significant for congestion and clear rhinorrhea, mild posterior pharyngeal erythema, chest clear to auscultation and mild abdominal tenderness that is generalized.  PCR COVID test obtained.  Current CDC guidelines, isolation protocol and ED precautions reviewed with patient.  If she test positive, advised her she might still be candidate for antiviral therapy, otherwise antibody therapy.  Advised her someone will contact her and that we discussed if she is positive.  At this time, advised to increase rest and fluids and continue decongestants.  Explained that if  your blood pressure is getting too high she should switch to Coricidin.  Also I have sent in Zofran.  Advised her to follow-up with us as needed for any worsening symptoms or she is not starting to feel better over the next 7 to 10 days.  Patient agreeable.   Final Clinical Impressions(s) / UC Diagnoses   Final diagnoses:  Viral illness  Nausea vomiting and diarrhea  Cough     Discharge Instructions      VIRAL ILLNESS: Your symptoms are consistent with a viral illness, COVID test to be back tomorrow.  Someone will contact you if positive and discuss treatment options.  At this time, increase rest and fluids.  Have sent Zofran to the pharmacy for your nausea and vomiting.  Eat bland meals.  Continue with the decongestants but if blood pressure is staying high, switch to Coricidin HBP.  You have received COVID testing today either for positive exposure, concerning symptoms that could be related to COVID infection, screening purposes, or re-testing after confirmed positive.  Your test obtained today checks for active viral infection in the last 1-2 weeks. If your test is negative now, you can still test positive later. So, if you do develop symptoms you should either get re-tested and/or isolate x 5 days and then strict mask use x 5 days (unvaccinated) or mask use x 10 days (vaccinated). Please follow CDC guidelines.  While Rapid antigen tests come back in 15-20 minutes, send out PCR/molecular test results typically come back within 1-3 days. In the mean time, if you are symptomatic, assume this could be a positive test and treat/monitor yourself as if you do have COVID.   We will call with test results if positive. Please download the MyChart app  and set up a profile to access test results.   If symptomatic, go home and rest. Push fluids. Take Tylenol as needed for discomfort. Gargle warm salt water. Throat lozenges. Take Mucinex DM or Robitussin for cough. Humidifier in bedroom to ease  coughing. Warm showers. Also review the COVID handout for more information.  COVID-19 INFECTION: The incubation period of COVID-19 is approximately 14 days after exposure, with most symptoms developing in roughly 4-5 days. Symptoms may range in severity from mild to critically severe. Roughly 80% of those infected will have mild symptoms. People of any age may become infected with COVID-19 and have the ability to transmit the virus. The most common symptoms include: fever, fatigue, cough, body aches, headaches, sore throat, nasal congestion, shortness of breath, nausea, vomiting, diarrhea, changes in smell and/or taste.    COURSE OF ILLNESS Some patients may begin with mild disease which can progress quickly into critical symptoms. If your symptoms are worsening please call ahead to the Emergency Department and proceed there for further treatment. Recovery time appears to be roughly 1-2 weeks for mild symptoms and 3-6 weeks for severe disease.   GO IMMEDIATELY TO ER FOR FEVER YOU ARE UNABLE TO GET DOWN WITH TYLENOL, BREATHING PROBLEMS, CHEST PAIN, FATIGUE, LETHARGY, INABILITY TO EAT OR DRINK, ETC  QUARANTINE AND ISOLATION: To help decrease the spread of COVID-19 please remain isolated if you have COVID infection or are highly suspected to have COVID infection. This means -stay home and isolate to one room in the home if you live with others. Do not share a bed or bathroom with others while ill, sanitize and wipe down all countertops and keep common areas clean and disinfected. Stay home for 5 days. If you have no symptoms or your symptoms are resolving after 5 days, you can leave your house. Continue to wear a mask around others for 5 additional days. If you have been in close contact (within 6 feet) of someone diagnosed with COVID 19, you are advised to quarantine in your home for 14 days as symptoms can develop anywhere from 2-14 days after exposure to the virus. If you develop symptoms, you  must  isolate.  Most current guidelines for COVID after exposure -unvaccinated: isolate 5 days and strict mask use x 5 days. Test on day 5 is possible -vaccinated: wear mask x 10 days if symptoms do not develop -You do not necessarily need to be tested for COVID if you have + exposure and  develop symptoms. Just isolate at home x10 days from symptom onset During this global pandemic, CDC advises to practice social distancing, try to stay at least 19ft away from others at all times. Wear a face covering. Wash and sanitize your hands regularly and avoid going anywhere that is not necessary.  KEEP IN MIND THAT THE COVID TEST IS NOT 100% ACCURATE AND YOU SHOULD STILL DO EVERYTHING TO PREVENT POTENTIAL SPREAD OF VIRUS TO OTHERS (WEAR MASK, WEAR GLOVES, WASH HANDS AND SANITIZE REGULARLY). IF INITIAL TEST IS NEGATIVE, THIS MAY NOT MEAN YOU ARE DEFINITELY NEGATIVE. MOST ACCURATE TESTING IS DONE 5-7 DAYS AFTER EXPOSURE.   It is not advised by CDC to get re-tested after receiving a positive COVID test since you can still test positive for weeks to months after you have already cleared the virus.   *If you have not been vaccinated for COVID, I strongly suggest you consider getting vaccinated as long as there are no contraindications.       ED Prescriptions  None    PDMP not reviewed this encounter.   Shirlee Latch, PA-C 01/29/21 1131

## 2021-01-29 NOTE — ED Triage Notes (Signed)
Pt c/o nasal congestion, cough, nausea, vomiting, fever (100.5), diarrhea. Started about 4 days ago.

## 2021-01-30 LAB — SARS CORONAVIRUS 2 (TAT 6-24 HRS): SARS Coronavirus 2: POSITIVE — AB

## 2021-02-03 ENCOUNTER — Ambulatory Visit
Admission: EM | Admit: 2021-02-03 | Discharge: 2021-02-03 | Disposition: A | Discharge status: Home / Self Care | Payer: Medicare Other | Attending: Emergency Medicine | Admitting: Emergency Medicine

## 2021-02-03 ENCOUNTER — Other Ambulatory Visit: Payer: Self-pay

## 2021-02-03 DIAGNOSIS — U071 COVID-19: Secondary | ICD-10-CM | POA: Diagnosis not present

## 2021-02-03 DIAGNOSIS — R059 Cough, unspecified: Secondary | ICD-10-CM | POA: Diagnosis not present

## 2021-02-03 DIAGNOSIS — Z760 Encounter for issue of repeat prescription: Secondary | ICD-10-CM

## 2021-02-03 DIAGNOSIS — J0100 Acute maxillary sinusitis, unspecified: Secondary | ICD-10-CM

## 2021-02-03 HISTORY — DX: Bipolar disorder, unspecified: F31.9

## 2021-02-03 MED ORDER — AEROCHAMBER PLUS MISC: 2 refills | Status: AC

## 2021-02-03 MED ORDER — HYDROCOD POLST-CPM POLST ER 10-8 MG/5ML PO SUER: 5.0000 mL | Freq: Two times a day (BID) | ORAL | 0 refills | Status: AC | PRN

## 2021-02-03 MED ORDER — DOXYCYCLINE HYCLATE 100 MG PO CAPS: 100.0000 mg | ORAL_CAPSULE | Freq: Two times a day (BID) | ORAL | 0 refills | Status: AC

## 2021-02-03 MED ORDER — ALBUTEROL SULFATE HFA 108 (90 BASE) MCG/ACT IN AERS: 1.0000 | INHALATION_SPRAY | RESPIRATORY_TRACT | 0 refills | Status: AC | PRN

## 2021-02-03 MED ORDER — FLUTICASONE PROPIONATE 50 MCG/ACT NA SUSP: 2.0000 | Freq: Every day | NASAL | 0 refills | Status: AC

## 2021-02-03 MED ORDER — ONDANSETRON HCL 4 MG PO TABS: 4.0000 mg | ORAL_TABLET | Freq: Four times a day (QID) | ORAL | 0 refills | Status: AC

## 2021-02-03 MED ORDER — PREDNISONE 20 MG PO TABS: 40.0000 mg | ORAL_TABLET | Freq: Every day | ORAL | 0 refills | Status: AC

## 2021-02-03 NOTE — ED Provider Notes (Signed)
HPI  SUBJECTIVE:  Frances Stanley is a 60 y.o. female who presents with COVID symptoms starting on 6/13.  She was diagnosed with COVID on 6/15.  She reports persistent fevers T-max 100.7 this morning, body aches, headaches, nasal congestion, postnasal drip, cough, wheezing, shortness of breath, fatigue, nausea dyspnea on exertion.  States that she is unable to sleep at night secondary to the cough.  She reports vomiting 2 days ago, which has resolved with Zofran.  She is tolerating p.o. now.  No Sinus pain or pressure, sore throat, chest pain.  She has been taking Tylenol and Zofran.  Symptoms are better with rest.  Symptoms are worse with walking around.  She has a past medical history of reactive airway disease, bipolar disease on lithium.  PMD: [facility].    Past Medical History:  Diagnosis Date   Bipolar disease, chronic (HCC)    Clavicle fracture    Depression    Schizophrenia (HCC)    pt states this is not correct    Past Surgical History:  Procedure Laterality Date   APPENDECTOMY     BREAST ENHANCEMENT SURGERY Bilateral    and removal   BREAST SURGERY     CESAREAN SECTION     CHOLECYSTECTOMY     CLAVICLE SURGERY     CLOSED REDUCTION FINGER WITH PERCUTANEOUS PINNING Left 09/16/2017   Procedure: CLOSED REDUCTION FINGER WITH PERCUTANEOUS PINNING;  Surgeon: Kennedy Bucker, MD;  Location: [facility];  Service: Orthopedics;  Laterality: Left;    History reviewed. No pertinent family history.  Social History   Tobacco Use   Smoking status: Never   Smokeless tobacco: Never  Vaping Use   Vaping Use: Never used  Substance Use Topics   Alcohol use: Yes   Drug use: No    No current facility-administered medications for this encounter.  Current Outpatient Medications:    acetaminophen (TYLENOL) 500 MG tablet, Take 500 mg by mouth every 8 (eight) hours as needed for mild pain or headache., Disp: , Rfl:    albuterol (VENTOLIN HFA) 108 (90 Base) MCG/ACT inhaler, Inhale 1-2  puffs into the lungs every 4 (four) hours as needed for wheezing or shortness of breath., Disp: 1 each, Rfl: 0   atorvastatin (LIPITOR) 10 MG tablet, Take 1 tablet by mouth daily., Disp: , Rfl:    bismuth subsalicylate (PEPTO BISMOL) 262 MG/15ML suspension, Take 30 mLs by mouth 3 (three) times daily as needed for indigestion., Disp: , Rfl:    carboxymethylcellulose (REFRESH PLUS) 0.5 % SOLN, Place 1 drop into both eyes 3 (three) times daily as needed (dry eyes)., Disp: , Rfl:    chlorpheniramine-HYDROcodone (TUSSIONEX PENNKINETIC ER) 10-8 MG/5ML SUER, Take 5 mLs by mouth every 12 (twelve) hours as needed for cough., Disp: 60 mL, Rfl: 0   fluticasone (FLONASE) 50 MCG/ACT nasal spray, Place 2 sprays into both nostrils daily., Disp: 16 g, Rfl: 0   lithium carbonate (ESKALITH) 450 MG CR tablet, , Disp: , Rfl:    predniSONE (DELTASONE) 20 MG tablet, Take 2 tablets (40 mg total) by mouth daily with breakfast for 5 days., Disp: 10 tablet, Rfl: 0   Spacer/Aero-Holding Chambers (AEROCHAMBER PLUS) inhaler, Use with inhaler, Disp: 1 each, Rfl: 2   VRAYLAR 1.5 MG capsule, Take 1.5 mg by mouth daily., Disp: , Rfl:    ondansetron (ZOFRAN) 4 MG tablet, Take 1 tablet (4 mg total) by mouth every 6 (six) hours., Disp: 20 tablet, Rfl: 0  Allergies  Allergen Reactions  Morphine And Related Rash     ROS  As noted in HPI.   Physical Exam  BP (!) 159/81 (BP Location: Left Arm)   Pulse 60   Temp 98.3 F (36.8 C) (Oral)   Resp 20   SpO2 100%   Constitutional: Well developed, well nourished, no acute distress Eyes:  EOMI, conjunctiva normal bilaterally HENT: Normocephalic, atraumatic,mucus membranes moist.  Positive nasal congestion.  Erythematous, swollen turbinates.  Positive maxillary sinus tenderness.  No frontal sinus tenderness.  No postnasal drip. Respiratory: Normal inspiratory effort, lungs clear bilaterally Cardiovascular: Normal rate, regular rhythm, no murmurs rubs or gallops GI:  nondistended skin: No rash, skin intact Musculoskeletal: no deformities Neurologic: Alert & oriented x 3, no focal neuro deficits Psychiatric: Speech and behavior appropriate   ED Course   Medications - No data to display  No orders of the defined types were placed in this encounter.   No results found for this or any previous visit (from the past 24 hour(s)). No results found.  ED Clinical Impression  1. COVID-19 virus infection   2. Acute non-recurrent maxillary sinusitis   3. Cough      ED Assessment/Plan  [place] [facility] database reviewed for this patient, and feel that the risk/benefit ratio today is favorable for proceeding with a prescription for controlled substance.  last opiate prescription was for Tussionex in 2/21.  Concern for secondary sinusitis or pneumonia from recent COVID infection.  Deferring chest x-ray because it would not change management.  Home with doxycycline which will cover sinusitis in addition to pneumonia, saline nasal irrigation, Mucinex D, regularly scheduled albuterol with a spacer for the next 4 days due to the RAD, then as needed, prednisone 40 mg for 5 days, Tylenol/ibuprofen.  Will refill Zofran and Tussionex.  Discussed labs, imaging, MDM, treatment plan, and plan for follow-up with patient. Discussed sn/sx that should prompt return to the ED. patient agrees with plan.   Meds ordered this encounter  Medications   ondansetron (ZOFRAN) 4 MG tablet    Sig: Take 1 tablet (4 mg total) by mouth every 6 (six) hours.    Dispense:  20 tablet    Refill:  0   fluticasone (FLONASE) 50 MCG/ACT nasal spray    Sig: Place 2 sprays into both nostrils daily.    Dispense:  16 g    Refill:  0   predniSONE (DELTASONE) 20 MG tablet    Sig: Take 2 tablets (40 mg total) by mouth daily with breakfast for 5 days.    Dispense:  10 tablet    Refill:  0   albuterol (VENTOLIN HFA) 108 (90 Base) MCG/ACT inhaler    Sig: Inhale 1-2 puffs into the lungs every 4 (four)  hours as needed for wheezing or shortness of breath.    Dispense:  1 each    Refill:  0   Spacer/Aero-Holding Chambers (AEROCHAMBER PLUS) inhaler    Sig: Use with inhaler    Dispense:  1 each    Refill:  2    Please educate patient on use   chlorpheniramine-HYDROcodone (TUSSIONEX PENNKINETIC ER) 10-8 MG/5ML SUER    Sig: Take 5 mLs by mouth every 12 (twelve) hours as needed for cough.    Dispense:  60 mL    Refill:  0      *This clinic note was created using Scientist, clinical (histocompatibility and immunogenetics). Therefore, there may be occasional mistakes despite careful proofreading.  ?    Domenick Gong, MD 02/05/21 [id number]

## 2021-02-03 NOTE — Discharge Instructions (Addendum)
Finish the antibiotics, even if you feel better.  2 puffs from your albuterol inhaler using your spacer every 4 hours for 2 days, then every 6 hours for 2 days, then as needed.  You may back off on the albuterol if you are starting to feel better.  Saline nasal irrigation with a NeilMed sinus rinse and distilled water as often as you want, Mucinex D.  No ibuprofen because of the lithium.  You may take 1000 mg of Tylenol 3-4 times a day as needed for pain.  Tussionex for the cough at night, Zofran as needed for nausea.

## 2021-02-03 NOTE — ED Triage Notes (Signed)
Pt presents with worsening cough.  COVID + here on 06/15.  Reports ongoing nausea, cough, fatigue, body aches, flushed.  Has nausea which is helped by Zofran.  She has a couple left.  Had Tylenol at 0630 with some relief.  (Initial s/s started approx 06/13)

## 2023-05-25 ENCOUNTER — Other Ambulatory Visit: Payer: Self-pay

## 2023-05-25 ENCOUNTER — Emergency Department
Admission: EM | Admit: 2023-05-25 | Discharge: 2023-05-25 | Disposition: A | Discharge status: Home / Self Care | Payer: Medicare Other | Attending: Emergency Medicine | Admitting: Emergency Medicine

## 2023-05-25 ENCOUNTER — Emergency Department: Payer: Medicare Other

## 2023-05-25 DIAGNOSIS — M25512 Pain in left shoulder: Secondary | ICD-10-CM | POA: Insufficient documentation

## 2023-05-25 DIAGNOSIS — S4992XA Unspecified injury of left shoulder and upper arm, initial encounter: Secondary | ICD-10-CM | POA: Diagnosis present

## 2023-05-25 DIAGNOSIS — S42292A Other displaced fracture of upper end of left humerus, initial encounter for closed fracture: Secondary | ICD-10-CM

## 2023-05-25 DIAGNOSIS — Y9352 Activity, horseback riding: Secondary | ICD-10-CM | POA: Insufficient documentation

## 2023-05-25 DIAGNOSIS — S42202A Unspecified fracture of upper end of left humerus, initial encounter for closed fracture: Secondary | ICD-10-CM | POA: Diagnosis not present

## 2023-05-25 MED ORDER — OXYCODONE-ACETAMINOPHEN 5-325 MG PO TABS
1.0000 | ORAL_TABLET | Freq: Four times a day (QID) | ORAL | 0 refills | Status: AC | PRN
Start: 2023-05-25 — End: 2024-05-24

## 2023-05-25 MED ORDER — ONDANSETRON HCL 4 MG/2ML IJ SOLN
4.0000 mg | Freq: Once | INTRAMUSCULAR | Status: AC
  Administered 2023-05-25: 4 mg via INTRAVENOUS
  Filled 2023-05-25: qty 2

## 2023-05-25 MED ORDER — HYDROMORPHONE HCL 1 MG/ML IJ SOLN
0.5000 mg | Freq: Once | INTRAMUSCULAR | Status: AC
  Administered 2023-05-25: 0.5 mg via INTRAVENOUS
  Filled 2023-05-25: qty 0.5

## 2023-05-25 MED ORDER — ONDANSETRON 4 MG PO TBDP: 4.0000 mg | ORAL_TABLET | Freq: Three times a day (TID) | ORAL | 0 refills | Status: AC | PRN

## 2023-05-25 MED ORDER — ONDANSETRON HCL 4 MG/2ML IJ SOLN: 4.0000 mg | Freq: Once | INTRAMUSCULAR | Status: DC

## 2023-05-25 MED ORDER — KETOROLAC TROMETHAMINE 30 MG/ML IJ SOLN
15.0000 mg | Freq: Once | INTRAMUSCULAR | Status: AC
  Administered 2023-05-25: 15 mg via INTRAVENOUS
  Filled 2023-05-25: qty 1

## 2023-05-25 MED ORDER — FENTANYL CITRATE PF 50 MCG/ML IJ SOSY
50.0000 ug | PREFILLED_SYRINGE | INTRAMUSCULAR | Status: DC | PRN
  Administered 2023-05-25: 50 ug via INTRAVENOUS
  Filled 2023-05-25: qty 1

## 2023-05-25 MED ORDER — OXYCODONE-ACETAMINOPHEN 5-325 MG PO TABS
2.0000 | ORAL_TABLET | Freq: Once | ORAL | Status: AC
  Administered 2023-05-25: 2 via ORAL
  Filled 2023-05-25: qty 2

## 2023-05-25 MED ORDER — DOCUSATE SODIUM 250 MG PO CAPS: 250.0000 mg | ORAL_CAPSULE | Freq: Every day | ORAL | 0 refills | Status: AC

## 2023-05-25 NOTE — ED Notes (Signed)
Assisted to restroom, blanket provided.

## 2023-05-25 NOTE — ED Notes (Signed)
Pt requesting additional pain meds, Dr Erma Heritage notified

## 2023-05-25 NOTE — ED Triage Notes (Signed)
Pt to ED [facility] from park where riding horse, fell off horse and landed on left side. Was wearing helmet. Denies LOC. C/o pain to left shoulder.

## 2023-05-25 NOTE — ED Provider Notes (Signed)
[facility] Provider Note    Event Date/Time   First MD Initiated Contact with Patient 05/25/23 1138     (approximate)   History   Fall   HPI  Frances Stanley is a 62 y.o. female here with left arm pain.  The patient was riding a horse earlier today.  She was helmeted.  The horse got scared and threw her off.  She landed directly onto her left shoulder.  She subsequently has had aching, throbbing, severe, 10 of 10 left shoulder pain.  Denies any distal numbness or weakness.  She did not hit her head hard.  No headache.  No loss conscious.  No neck pain.  She is on blood thinners.     Physical Exam   Triage Vital Signs: ED Triage Vitals  Encounter Vitals Group     BP 05/25/23 1125 (!) 161/74     Systolic BP Percentile --      Diastolic BP Percentile --      Pulse Rate 05/25/23 1125 71     Resp 05/25/23 1125 (!) 22     Temp 05/25/23 1123 97.7 F (36.5 C)     Temp src --      SpO2 05/25/23 1125 99 %     Weight 05/25/23 1123 185 lb (83.9 kg)     Height 05/25/23 1123 5\' 7"  (1.702 m)     Head Circumference --      Peak Flow --      Pain Score 05/25/23 1123 6     Pain Loc --      Pain Education --      Exclude from Growth Chart --     Most recent vital signs: Vitals:   05/25/23 1430 05/25/23 1500  BP: (!) 136/59 (!) 139/56  Pulse: (!) 56 (!) 57  Resp:    Temp:    SpO2: 93% 93%     General: Awake, no distress.  CV:  Good peripheral perfusion.  Resp:  Normal work of breathing.  Abd:  No distention.  Other:  No apparent head trauma.  No midline or paraspinal cervical tenderness.  Strength 5 bilateral upper and lower extremities.  Normal sensation light touch.  There is significant tenderness to palpation over the left upper arm/proximal humerus.  No open wounds.  2+ radial pulse.  Strength out of 5 with grip strength and wrist extension and flexion.   ED Results / Procedures / Treatments   Labs (all labs ordered are listed, but only  abnormal results are displayed) Labs Reviewed - No data to display   EKG Normal sinus rhythm, VR 69. PR 161, QRS 84, QTc 435. No acute ST elevations or depressions. No ischemia or infarct.   RADIOLOGY DG humerus left: Nondisplaced fracture of the left humeral head DG shoulder left: Nondisplaced fracture, remote clavicle fracture   I also independently reviewed and agree with radiologist interpretations.   PROCEDURES:  Critical Care performed: No   MEDICATIONS ORDERED IN ED: Medications  fentaNYL (SUBLIMAZE) injection 50 mcg (50 mcg Intravenous Given 05/25/23 1130)  ondansetron (ZOFRAN) injection 4 mg (4 mg Intravenous Not Given 05/25/23 1326)  ondansetron (ZOFRAN) injection 4 mg (4 mg Intravenous Given 05/25/23 1129)  HYDROmorphone (DILAUDID) injection 0.5 mg (0.5 mg Intravenous Given 05/25/23 1236)  oxyCODONE-acetaminophen (PERCOCET/ROXICET) 5-325 MG per tablet 2 tablet (2 tablets Oral Given 05/25/23 1324)  ketorolac (TORADOL) 30 MG/ML injection 15 mg (15 mg Intravenous Given 05/25/23 1322)     IMPRESSION /  MDM / ASSESSMENT AND PLAN / ED COURSE  I reviewed the triage vital signs and the nursing notes.                              Differential diagnosis includes, but is not limited to, humeral fracture, shoulder dislocation, clavicle fracture, cervical injury  Patient's presentation is most consistent with acute presentation with potential threat to life or bodily function.  The patient is on the cardiac monitor to evaluate for evidence of arrhythmia and/or significant heart rate changes   62 year old female here with left shoulder pain after fall from horse.  Patient was wearing a helmet.  She has no loss of consciousness.  She on blood thinners.  She has no neck pain to suggest cervical radiculopathy.  Plain films showed nondisplaced fracture of the left humeral head.  She is neurovascularly intact distally.  No chest pain or signs to suggest chest trauma or rib fracture.   Discussed the case with Dr. Dartha Lodge of orthopedics.  Will place in a sling and discharged with supportive care and analgesia.  Return precautions given.  No other injuries noted.   FINAL CLINICAL IMPRESSION(S) / ED DIAGNOSES   Final diagnoses:  Closed fracture of head of left humerus, initial encounter  Fall from horse, initial encounter     Rx / DC Orders   ED Discharge Orders          Ordered    oxyCODONE-acetaminophen (PERCOCET) 5-325 MG tablet  Every 6 hours PRN        05/25/23 1502    ondansetron (ZOFRAN-ODT) 4 MG disintegrating tablet  Every 8 hours PRN        05/25/23 1502    docusate sodium (COLACE) 250 MG capsule  Daily        05/25/23 1502             Note:  This document was prepared using Dragon voice recognition software and may include unintentional dictation errors.   Shaune Pollack, MD 05/25/23 [phone]

## 2023-05-25 NOTE — Discharge Instructions (Signed)
No lifting or use of left hand/arm until seen by ortho  Call Dr. Joice Lofts to set up a follow-up  Take the medications as prescribed  You can take Ibuprofen as well to help with pain
